# Patient Record
Sex: Male | Born: 1945 | Race: White | Hispanic: No | Marital: Married | Smoking: Never smoker
Health system: Southern US, Community
[De-identification: ages and names within clinical notes are randomized; demographics above are authoritative.]

## PROBLEM LIST (undated history)

## (undated) DIAGNOSIS — R05 Cough: Secondary | ICD-10-CM

## (undated) DIAGNOSIS — E785 Hyperlipidemia, unspecified: Secondary | ICD-10-CM

## (undated) DIAGNOSIS — N400 Enlarged prostate without lower urinary tract symptoms: Secondary | ICD-10-CM

## (undated) DIAGNOSIS — R0683 Snoring: Secondary | ICD-10-CM

## (undated) DIAGNOSIS — C4491 Basal cell carcinoma of skin, unspecified: Secondary | ICD-10-CM

## (undated) DIAGNOSIS — C61 Malignant neoplasm of prostate: Secondary | ICD-10-CM

## (undated) DIAGNOSIS — G2581 Restless legs syndrome: Secondary | ICD-10-CM

## (undated) DIAGNOSIS — C4492 Squamous cell carcinoma of skin, unspecified: Secondary | ICD-10-CM

## (undated) DIAGNOSIS — R059 Cough, unspecified: Secondary | ICD-10-CM

## (undated) HISTORY — PX: NASAL TURBINATE REDUCTION: SHX2072

## (undated) HISTORY — PX: SINOSCOPY: SHX187

## (undated) HISTORY — PX: TONSILLECTOMY: SUR1361

## (undated) HISTORY — PX: SEPTOPLASTY: SUR1290

## (undated) HISTORY — DX: Restless legs syndrome: G25.81

## (undated) HISTORY — PX: APPENDECTOMY: SHX54

## (undated) HISTORY — PX: OTHER SURGICAL HISTORY: SHX169

## (undated) HISTORY — DX: Malignant neoplasm of prostate: C61

## (undated) HISTORY — DX: Benign prostatic hyperplasia without lower urinary tract symptoms: N40.0

## (undated) HISTORY — DX: Snoring: R06.83

## (undated) HISTORY — DX: Hyperlipidemia, unspecified: E78.5

---
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
## 1998-09-12 ENCOUNTER — Ambulatory Visit (HOSPITAL_COMMUNITY): Admission: RE | Admit: 1998-09-12 | Discharge: 1998-09-12 | Payer: Self-pay | Admitting: Orthopaedic Surgery

## 2003-03-01 DIAGNOSIS — C4491 Basal cell carcinoma of skin, unspecified: Secondary | ICD-10-CM

## 2003-03-01 HISTORY — DX: Basal cell carcinoma of skin, unspecified: C44.91

## 2004-05-09 ENCOUNTER — Encounter: Admission: RE | Admit: 2004-05-09 | Discharge: 2004-05-09 | Payer: Self-pay | Admitting: Family Medicine

## 2005-05-29 ENCOUNTER — Ambulatory Visit: Payer: Self-pay | Admitting: Internal Medicine

## 2006-03-07 ENCOUNTER — Ambulatory Visit: Payer: Self-pay | Admitting: Internal Medicine

## 2006-04-14 ENCOUNTER — Ambulatory Visit (HOSPITAL_BASED_OUTPATIENT_CLINIC_OR_DEPARTMENT_OTHER): Admission: RE | Admit: 2006-04-14 | Discharge: 2006-04-14 | Payer: Self-pay | Admitting: Internal Medicine

## 2006-04-20 ENCOUNTER — Ambulatory Visit: Payer: Self-pay | Admitting: Internal Medicine

## 2006-05-05 ENCOUNTER — Ambulatory Visit: Payer: Self-pay | Admitting: Internal Medicine

## 2007-02-28 DIAGNOSIS — R0609 Other forms of dyspnea: Secondary | ICD-10-CM | POA: Insufficient documentation

## 2007-02-28 DIAGNOSIS — J302 Other seasonal allergic rhinitis: Secondary | ICD-10-CM | POA: Insufficient documentation

## 2007-02-28 DIAGNOSIS — R0989 Other specified symptoms and signs involving the circulatory and respiratory systems: Secondary | ICD-10-CM

## 2007-02-28 DIAGNOSIS — J3089 Other allergic rhinitis: Secondary | ICD-10-CM

## 2007-04-30 ENCOUNTER — Encounter: Payer: Self-pay | Admitting: Internal Medicine

## 2007-05-07 ENCOUNTER — Encounter: Payer: Self-pay | Admitting: Internal Medicine

## 2007-05-09 ENCOUNTER — Telehealth: Payer: Self-pay | Admitting: Internal Medicine

## 2007-05-22 ENCOUNTER — Ambulatory Visit: Payer: Self-pay | Admitting: Internal Medicine

## 2007-07-23 ENCOUNTER — Telehealth (INDEPENDENT_AMBULATORY_CARE_PROVIDER_SITE_OTHER): Payer: Self-pay | Admitting: *Deleted

## 2007-08-03 ENCOUNTER — Encounter: Payer: Self-pay | Admitting: Internal Medicine

## 2007-10-14 ENCOUNTER — Encounter: Payer: Self-pay | Admitting: Internal Medicine

## 2007-12-28 ENCOUNTER — Encounter: Payer: Self-pay | Admitting: Internal Medicine

## 2008-06-22 ENCOUNTER — Ambulatory Visit: Payer: Self-pay | Admitting: Internal Medicine

## 2008-06-22 DIAGNOSIS — E785 Hyperlipidemia, unspecified: Secondary | ICD-10-CM

## 2009-06-22 ENCOUNTER — Ambulatory Visit: Payer: Self-pay | Admitting: Internal Medicine

## 2010-06-19 NOTE — Assessment & Plan Note (Signed)
Summary: 12 months/apc   Primary Provider/Referring Provider:  Lupe Carney  CC:  Yearly follow up visit-cough-prod-green; head/nasal congestion.Marland Kitchen  History of Present Illness: 04-Jun-2007- History of Present Illness: Snoring Allergic rhinitis Worked with [facility]- Dr. Georgette Dover. Oral appliance works well enough. Did not have sleep apnea to begin with by NPSG so he doesn't want the expensw of repeating a sleep study with the oral appliance in place. Self pay for this. Worse if sleeps on back so he wears a fanny pack positioned to keep him on his side. Had also seen Dr. Annalee Genta who suggested septoplasty and introduced Veramyst which definitely works better than fluticasone but is too expensive. Takes Zyrtec in the AMs without EDS.  06/22/08- Snoring, allergic rhinitis.  Had some breakthrough snoring with the oral appliance and fannypack to keep him off of back. Dr Annalee Genta did septoplasty, turbinate reduction and uvulectomy. Breathes much better. Veramyst worked better than the fluticasone- better delivery system. More aware of post nasal draingage.Denies hoarseness, headache, earache.  June 22, 2009- Snoring, allergic rhinitis Has an incidental cold. We discussed Neti pot option. He frequently wakes with nasal congestion. We reviewed his use of nasal meds. Hx of nasal surgery and potential urinary retention with systemic decongestants again reviewed. He asks flu vax.      Current Medications (verified): 1)  Zocor 40 Mg Tabs (Simvastatin) .... Take 1 By Mouth Once Daily 2)  Requip 1 Mg  Tabs (Ropinirole Hcl) .... Take 1 Tablet By Mouth Once A Day 3)  Nasalcrom 5.2 Mg/act  Aers (Cromolyn Sodium) .Marland Kitchen.. 1-2 Sprays Each Nostril  4 X Daily 4)  Flonase 50 Mcg/act Susp (Fluticasone Propionate) .... Use 1-2 Sprays in Each Nostril Once Daily 5)  Glucosamine Chondr 1500 Complx  Caps (Glucosamine-Chondroit-Vit C-Mn) 6)  Multivitamins  Tabs (Multiple Vitamin) .... Take 1 By Mouth Once  Daily 7)  Zyrtec Allergy 10 Mg Tabs (Cetirizine Hcl) .... Take 1 By Mouth Once Daily  Allergies (verified): No Known Drug Allergies  Past History:  Past Medical History: Last updated: 06/22/2008 Allergic rhinitis snoring- NPSG NEG for OSA Hyperlipidemia Restless legs  Past Surgical History: Last updated: 06/22/2008 Platoplasty, septoplasty, turbinate reduction 2009 septoplasty age 53 Appendectomy Tonsillectomy  Family History: Last updated: June 04, 2007 Father died CVA  Social History: Last updated: 06/22/2008 Patient never smoked.  Runner Staff psychologist for [employer]  Risk Factors: Smoking Status: never (04-Jun-2007)  Review of Systems      See HPI  The patient denies anorexia, fever, weight loss, weight gain, vision loss, decreased hearing, hoarseness, chest pain, syncope, dyspnea on exertion, peripheral edema, prolonged cough, headaches, hemoptysis, and severe indigestion/heartburn.    Vital Signs:  Patient profile:   65 year old male Weight:      184.38 pounds O2 Sat:      95 % on Room air Pulse rate:   82 / minute BP sitting:   122 / 74  (left arm) Cuff size:   regular  Vitals Entered By: Reynaldo Minium CMA (June 22, 2009 9:12 AM)  O2 Flow:  Room air  Physical Exam  Additional Exam:  General: A/Ox3; pleasant and cooperative, NAD, WDWN SKIN: no rash, lesions NODES: no lymphadenopathy HEENT: [place]/AT, EOM- WNL, Conjuctivae- clear, PERRLA, TM-WNL, Nose- clear, Throat- clear, s/p UPPP NECK: Supple w/ fair ROM, JVD- none, normal carotid impulses w/o bruits Thyroid-  CHEST: Clear to P&A HEART: RRR, no m/g/r heard ABDOMEN: Soft and nl; EAV:WUJW, nl pulses, no edema  NEURO: Grossly intact to  observation      Impression & Recommendations:  Problem # 1:  ALLERGIC RHINITIS (ICD-477.9)  Overall controll has probably been adequate. We discussed Neti pot His updated medication list for this problem includes:    Nasalcrom 5.2 Mg/act Aers  (Cromolyn sodium) .Marland Kitchen... 1-2 sprays each nostril  4 x daily    Flonase 50 Mcg/act Susp (Fluticasone propionate) ..... Use 1-2 sprays in each nostril once daily    Zyrtec Allergy 10 Mg Tabs (Cetirizine hcl) .Marland Kitchen... Take 1 by mouth once daily  Problem # 2:  SNORING (ICD-786.09)  he had used oral appliance for awhile before UPPP. Wife tells him he is gradually snoring more agai-still less than originally. We discused mouthpieces and chinstraps.  Medications Added to Medication List This Visit: 1)  Zocor 40 Mg Tabs (Simvastatin) .... Take 1 by mouth once daily  Other Orders: Est. Patient Level II (29562) Flu Vaccine 53yrs + (13086) Admin 1st Vaccine (57846)  Patient Instructions: 1)  Please schedule a follow-up appointment as needed. 2)  Consider a Neti Pot/ squeeze bottle to rinse your nasal passages 3)  Consider Sudafed-PE and Afrin as OCCASIONAL decongestants 4)  Consider a chin strap to positiion your jaw for help with snoring 5)  Flu vax   Immunizations Administered:  Influenza Vaccine # 1:    Vaccine Type: Fluvax 3+    Site: left deltoid    Mfr: GlaxoSmithKline    Dose: 0.5 ml    Route: IM    Given by: Zackery Barefoot CMA    Exp. Date: 11/16/2009    Lot #: [id number]  Flu Vaccine Consent Questions:    Do you have a history of severe allergic reactions to this vaccine? no    Any prior history of allergic reactions to egg and/or gelatin? no    Do you have a sensitivity to the preservative Thimersol? no    Do you have a past history of Guillan-Barre Syndrome? no    Do you currently have an acute febrile illness? no    Have you ever had a severe reaction to latex? no    Vaccine information given and explained to patient? yes

## 2010-10-02 NOTE — Letter (Signed)
May 22, 2007    Maryjean Ka, D.D.S., M.Ed., M.S.  Assoc Professor, Dept of Dentistry  [employer] [facility]  [address]  [place], [place]  [address]   RE:  JIOVANNI, HEETER  MRN:  [id number]  /  DOB:  July 06, 1945   Dear Dr. Georgette Dover,   Thank you for your good help with Mr. Fetterman, who was fitted with an  oral appliance.  He returned for follow-up today saying his wife is  happier about his snoring, especially as long as he makes an effort to  sleep off the flat of his back.  His baseline sleep study had not shown  significant sleep apnea and he does not want to encourage the expense of  a follow-up sleep study now with the appliance in place.  He does agree  to continue following along.  Overall he is pleased and indicates  willing intention to continue using his oral appliance.  He is very  pleased with his interaction with you and thanked me for making the  referral.   Thank you for your help with all of our patients, and thank you also for  thinking of [place]  during the holiday season.  Warmest regards for the new  year.    Sincerely,      [name], MD, Tonny Bollman, FACP  Electronically Signed    CDY/MedQ  DD: 05/22/2007  DT: 05/23/2007  Job #: [id number]

## 2010-10-05 NOTE — Letter (Signed)
October 30, 2006     RE:  CARDIN, NITSCHKE  MRN:  [id number]  /  DOB:  22-Dec-1945   To whom it may concern:   Mr. Jon Ewing is under my care and requesting consideration for  coverage of an oral appliance to treat snoring and sleep disordered  breathing.  There is significant disruption at home because of his  snoring disturbing his wife's sleep.  He has received appropriate  medical treatment and surgery for reduction of turbinates in the past.  He is using a nasal steroid spray and antihistamines currently.  A  formal nocturnal polysomnogram on April 14, 2006, demonstrated  occasional obstructive events with an apnea/hypopnea index in the normal  range at 3.6 per hour, mild snoring on that study night, and  desaturation to a nadir of 85%.  We have worked through available  options and he is seeking evaluation and treatment through the dental  program at [facility] in [place], [place]  [place], anticipating he will be fitted with a mandibular advancement  device.  This problem has caused significant distress in his home.  I  hope you can be supportive.    Sincerely,      [name], MD, Tonny Bollman, FACP  Electronically Signed    CDY/MedQ  DD: 10/30/2006  DT: 10/30/2006  Job #: [id number]

## 2010-10-05 NOTE — Assessment & Plan Note (Signed)
[facility]                               PULMONARY OFFICE NOTE   Jon Ewing, Jon Ewing                       MRN:          [id number]  DATE:03/07/2006                            DOB:          07-06-1945    PULMONARY/SLEEP FOLLOWUP   PROBLEMS:  1. Snoring.  2. Allergic rhinitis.   HISTORY:  His wife continues to complain about his snoring, saying it is  getting worse.  He is not sure about witnessed apnea and does not think he  jerks his legs.  She is a light sleeper.  There is at least some daytime  tiredness and awareness that he wakes at night.  He has had more problem  this fall with nasal congestion, sneezing, and drip.  He had stopped Alavert  D and we discussed treatment options.   MEDICATION:  1. Lipitor 20 mg.  2. Flonase.   No medication allergy.   OBJECTIVE:  Weight 172 pounds, BP 118/78, pulse regular at 78, room air  saturation 97%.  Mild watery sniffing.  Conjunctivae are clear.  Turbinates  are a little edematous.  Color is about normal.  I do not see postnasal  drainage or polyps.  There is no cervical adenopathy and voice quality is  normal.  Lungs are clear.  Heart sounds are regular without murmur.   IMPRESSION:  1. Allergic rhinitis with seasonal exacerbation.  2. Chronic complaints of snoring with some septal deviation.  He has      decided it is time for a sleep study.   PLAN:  1. We discussed the basics of obstructive sleep apnea and its relation to      nasal congestion.  2. Schedule split protocol sleep study at the [facility] system center with      office return to follow.       [name], MD, FCCP, FACP   CDY/MedQ DD:  03/08/2006 DT:  03/10/2006 Job #:  [id number]   cc:   [name], M.D.  [facility]

## 2010-10-05 NOTE — Procedures (Signed)
NAME:  Jon Ewing, Jon Ewing                ACCOUNT NO.:  [id number]   MEDICAL RECORD NO.:  [id number]          PATIENT TYPE:  OUT   LOCATION:  SLEEP CENTER                 FACILITY:  [facility]   PHYSICIAN:  [name], MD, FCCP, FACPDATE OF BIRTH:  January 10[birth year] 1948   DATE OF STUDY:  04/14/2006                            NOCTURNAL POLYSOMNOGRAM   INDICATION FOR STUDY:  Hypersomnia with sleep apnea.   EPWORTH SLEEPINESS SCORE:  2/24, BMI 25.7, weight 172 pounds.   MEDICATIONS:  Lipitor and Flonase.   SLEEP ARCHITECTURE:  Total sleep time 282 minutes with sleep efficiency  73%, stage I was 6%, stage II 77%, stages III and IV 9%, REM 9% of total  sleep time.  Sleep latency 75 minutes, REM latency 107 minutes, awake  after sleep onset 25 minutes.  Arousal index 19.6.  No bedtime  medication was reported.   RESPIRATORY DATA:  Apnea/hypopnea index (AHI, RDI) 3.6 obstructive  events per hour which is within normal limits (normal range 0 to 5 per  hour).  There were 12 obstructive apneas at 5 hypopneas. Events were  predominantly noted as expected while sleeping supine.  REM AHI 31.8  There were insufficient events to trigger CPAP titration by split  protocol.   OXYGEN DATA:  Mild snoring with oxygen desaturation to a nadir of 85%.  Mean oxygen saturation through the study was 94% on room air.   CARDIAC DATA:  Normal sinus rhythm.   MOVEMENT-PARASOMNIA:  Frequent limb jerks with a total of 196 events  recorded of which only three were associated with arousal or awakening  for periodic limb movement with arousal index of 0.6 per hour.   IMPRESSIONS-RECOMMENDATIONS:  1. Short total sleep time with reduced REM percentage compared with      expected, of uncertain significance.  2. Occasional obstructive sleep disordered breathing events, AHI 3.6      per hour.  Events were associated with supine position and REM      sleep stage.  Mild snoring with oxygen desaturation to a nadir of      85%.  3.  Frequent limb jerks were not associated with definite sleep      disturbance on this study but suggest the possibility that this may      become a sleep disturbing factor at times while at home.      [name], MD, FCCP, FACP  Diplomate, Biomedical engineer of Sleep Medicine  Electronically Signed     CDY/MEDQ  D:  04/20/2006 14:07:41  T:  04/21/2006 11:32:48  Job:  [id number]

## 2010-10-05 NOTE — Assessment & Plan Note (Signed)
[facility]                             PULMONARY OFFICE NOTE   Jon Ewing, Jon Ewing                       MRN:          [id number]  DATE:05/05/2006                            DOB:          05/16/1946    PULMONARY/SLEEP MEDICINE FOLLOWUP:   PROBLEM:  1. Snoring.  2. Allergic rhinitis.   HISTORY:  His sleep study November 26 recorded a short total sleep time  with reduced REM.  He only had occasional obstructive events with an  index of 3.6 per hour which is within normal limits.  These were  primarily while sleeping supine and in REM, suggesting that slleping off  the flat of his back may be sufficient.  Oxygen desaturation was to 85%  transiently.  He had some leg jerks that were not associated with sleep  disturbance, raising the possibility that some times leg jerks may be  part of the problem at home, but again, the biggest complaint is really  from his wife about his snoring.  We talked through options in detail  today, including the available treatments for snoring up to and  including an option to self-pay for a trial of CPAP, which is not  usually cost effective in my experience for this problem.  He has not  been having substantial nasal congestion and he does have a history of  turbinate surgery.  We discussed options for treatment of leg jerks and  the availability of oral appliances and surgical evaluation.   MEDICATIONS:  1. Lipitor 20 mg.  2. Flonase 2 sprays each nostril.  3. He has tried ranitidine, Astelin, Alavert D and Vivactil without      adequate effect on snore.   No medication allergy.   OBJECTIVE:  Weight 175 pounds, blood pressure 128/82, pulse regular and  69, room air saturation 98%.  His nasal airways seems adequate and he can breath comfortably through  his nose with his mouth closed.  Pharynx is clear without erythema or  drainage.  There is no strider or thyromegaly.  LUNGS:  Are clear.  HEART:  Sounds are  regular without murmur.   IMPRESSION:  1. History of allergic rhinitis.  Skin test positive in the past and      managed with nasal steroids, as needed antihistamines.  2. Snoring.  3. 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   PLAN:  1. I offered referral for evaluation of an oral appliance to advance      his mandible and referral for ENT surgical evaluation to look at      what could be done with his upper airway because of complaint of      snoring.  2. Samples Duratuss PE 1 every 12 hours p.r.n.  3. Requip starter pack.  4. Scheduled to return in 1 year, earlier p.r.n.   ADDENDUM:  Date May 30, 2006.  He had called back December 26 asking for referral for an oral appliance  evaluation and referral for ENT evaluation.  We will help with  those.  He also would like prescriptions for Astelin nasal spray and for Requip  0.5 mg nightly, which were called in to [facility].  He saw Dorcas Mcmurray, DDS for consideration for an oral appliance for snoring and did  not have a good experience.  There was poor communication and he felt he  did not get enough explanation at that office.  I have offered contact  information for the dental program at [facility] and he is going  to call there to see what information he can get about oral appliances  for management of snoring and sleep apnea.  He is going to keep his ENT  appointment for evaluation scheduled with Dr. Annalee Genta on January 25.     [name], MD, Tonny Bollman, FACP  Electronically Signed    CDY/MedQ  DD: 05/30/2006  DT: 05/30/2006  Job #: [id number]   cc:   [name], M.D.  [name], M.D.

## 2010-12-31 ENCOUNTER — Other Ambulatory Visit: Payer: Self-pay | Admitting: Internal Medicine

## 2011-01-02 ENCOUNTER — Telehealth: Payer: Self-pay | Admitting: Internal Medicine

## 2011-01-02 MED ORDER — FLUTICASONE PROPIONATE 50 MCG/ACT NA SUSP: 2.0000 | Freq: Every day | NASAL | Status: DC

## 2011-01-02 NOTE — Telephone Encounter (Signed)
Pt has appt on 02-04-11 at 945am; he understands to keep this appt and I have sent 1 time only for Flonase.

## 2011-01-30 ENCOUNTER — Encounter: Payer: Self-pay | Admitting: Internal Medicine

## 2011-02-04 ENCOUNTER — Encounter: Payer: Self-pay | Admitting: Internal Medicine

## 2011-02-04 ENCOUNTER — Ambulatory Visit (INDEPENDENT_AMBULATORY_CARE_PROVIDER_SITE_OTHER): Payer: BC Managed Care – PPO | Admitting: Internal Medicine

## 2011-02-04 VITALS — BP 130/78 | HR 70 | Ht 68.0 in | Wt 175.8 lb

## 2011-02-04 DIAGNOSIS — R0609 Other forms of dyspnea: Secondary | ICD-10-CM

## 2011-02-04 DIAGNOSIS — R0989 Other specified symptoms and signs involving the circulatory and respiratory systems: Secondary | ICD-10-CM

## 2011-02-04 DIAGNOSIS — Z23 Encounter for immunization: Secondary | ICD-10-CM

## 2011-02-04 DIAGNOSIS — J309 Allergic rhinitis, unspecified: Secondary | ICD-10-CM

## 2011-02-04 MED ORDER — IPRATROPIUM BROMIDE 0.03 % NA SOLN: 2.0000 | Freq: Four times a day (QID) | NASAL | Status: DC

## 2011-02-04 NOTE — Assessment & Plan Note (Signed)
Some of his perennial irritation may be due to the old UPPP surgery and not amenable to medication. Plan- try Ipratropium           Flu vax

## 2011-02-04 NOTE — Progress Notes (Signed)
Subjective:    Patient ID: Jon Ewing, male    DOB: 1946-03-13, 65 y.o.   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 Annalee Genta. In the beginning a sleep study was negative. He has used an oral appliance. He is using Requip for limb jerks. He has a history of nasal surgery. He has slept with accommodation to keep him from sleeping on his back Daily nasal congestion is about the same chronically- spits up some drainage. Little chest discomfort. Denies infection or headaches, purulence, sneezing. Changed from zyrtec to generic claritin. Continues nasalcrom and flonase. Tried singulair years ago. Denies glaucoma or prostatism.  Review of Systems Constitutional:   No-   weight loss, night sweats, fevers, chills, fatigue, lassitude. HEENT:   No-  headaches, difficulty swallowing, tooth/dental problems, sore throat,       No-  sneezing, itching, ear ache,     +nasal congestion, post nasal drip,  CV:  No-   chest pain, orthopnea, PND, swelling in lower extremities, anasarca, dizziness, palpitations Resp: No-   shortness of breath with exertion or at rest.              No-   productive cough,  No non-productive cough,  No-  coughing up of blood.              No-   change in color of mucus.  No- wheezing.   Skin: No-   rash or lesions. GI:  No-   heartburn, indigestion, abdominal pain, nausea, vomiting, diarrhea,                 change in bowel habits, loss of appetite GU: No-   dysuria, change in color of urine, no urgency or frequency.  No- flank pain. MS:  No-   joint pain or swelling.  No- decreased range of motion.  No- back pain. Neuro-  Psych:  No- change in mood or affect. No depression or anxiety.  No memory loss.     Objective:   Physical Exam General- Alert, Oriented, Affect-appropriate, Distress- none acute,  medium build. Skin-  rash-none, lesions- none, excoriation- none Lymphadenopathy- none Head- atraumatic            Eyes- Gross vision intact, PERRLA, conjunctivae clear secretions            Ears- Hearing, canals- normal            Nose- Clear, No-Septal dev, mucus, polyps, erosion, perforation             Throat-   + s/p UPPP , mucosa clear , drainage- none, tonsils- atrophic Neck- flexible , trachea midline, no stridor , thyroid nl, carotid no bruit Chest - symmetrical excursion , unlabored           Heart/CV- RRR , no murmur , no gallop  , no rub, nl s1 s2                           - JVD- none , edema- none, stasis changes- none, varices- none           Lung- clear to P&A, wheeze- none, cough- none , dullness-none, rub- none           Chest wall-  Abd- tender-no, distended-no, bowel sounds-present, HSM- no Br/ Gen/ Rectal- Not done, not  indicated Extrem- cyanosis- none, clubbing, none, atrophy- none, strength- nl Neuro- grossly intact to observation         Assessment & Plan:

## 2011-02-04 NOTE — Patient Instructions (Signed)
Script sent to try adding ipratropium nasal spray on an as-needed basis  Flu vax

## 2011-02-08 ENCOUNTER — Other Ambulatory Visit: Payer: Self-pay | Admitting: Internal Medicine

## 2011-02-09 NOTE — Assessment & Plan Note (Signed)
Makes an effort to sleep often for his back has not gained weight.

## 2011-11-05 ENCOUNTER — Other Ambulatory Visit: Payer: Self-pay | Admitting: Internal Medicine

## 2012-03-11 ENCOUNTER — Other Ambulatory Visit: Payer: Self-pay | Admitting: Internal Medicine

## 2012-05-04 ENCOUNTER — Other Ambulatory Visit: Payer: Self-pay | Admitting: Internal Medicine

## 2012-05-04 MED ORDER — FLUTICASONE PROPIONATE 50 MCG/ACT NA SUSP: NASAL | Status: DC

## 2012-05-04 NOTE — Telephone Encounter (Signed)
Rx refilled this time only-note sent to have patient make and keep OV with CY for more refills.

## 2013-10-13 ENCOUNTER — Other Ambulatory Visit: Payer: Self-pay | Admitting: Family Medicine

## 2013-10-13 ENCOUNTER — Ambulatory Visit
Admission: RE | Admit: 2013-10-13 | Discharge: 2013-10-13 | Disposition: A | Discharge status: Home / Self Care | Payer: Medicare Other | Source: Ambulatory Visit | Attending: Family Medicine | Admitting: Family Medicine

## 2013-10-13 DIAGNOSIS — M25579 Pain in unspecified ankle and joints of unspecified foot: Secondary | ICD-10-CM

## 2014-03-15 ENCOUNTER — Ambulatory Visit (HOSPITAL_COMMUNITY): Payer: Medicare Other | Attending: Internal Medicine | Admitting: Radiology

## 2014-03-15 ENCOUNTER — Other Ambulatory Visit (HOSPITAL_COMMUNITY): Payer: Self-pay | Admitting: Family Medicine

## 2014-03-15 DIAGNOSIS — R011 Cardiac murmur, unspecified: Secondary | ICD-10-CM

## 2014-03-15 NOTE — Progress Notes (Signed)
Echocardiogram performed.  

## 2014-07-04 ENCOUNTER — Encounter: Payer: Self-pay | Admitting: Internal Medicine

## 2014-07-04 ENCOUNTER — Other Ambulatory Visit (INDEPENDENT_AMBULATORY_CARE_PROVIDER_SITE_OTHER): Payer: Medicare Other

## 2014-07-04 ENCOUNTER — Encounter (INDEPENDENT_AMBULATORY_CARE_PROVIDER_SITE_OTHER): Payer: Self-pay

## 2014-07-04 ENCOUNTER — Ambulatory Visit (INDEPENDENT_AMBULATORY_CARE_PROVIDER_SITE_OTHER): Payer: Medicare Other | Admitting: Internal Medicine

## 2014-07-04 VITALS — BP 120/70 | HR 80 | Ht 68.0 in | Wt 182.6 lb

## 2014-07-04 DIAGNOSIS — J309 Allergic rhinitis, unspecified: Secondary | ICD-10-CM

## 2014-07-04 DIAGNOSIS — J3089 Other allergic rhinitis: Secondary | ICD-10-CM

## 2014-07-04 DIAGNOSIS — J302 Other seasonal allergic rhinitis: Secondary | ICD-10-CM

## 2014-07-04 LAB — CBC WITH DIFFERENTIAL/PLATELET
BASOS PCT: 1.4 % (ref 0.0–3.0)
Basophils Absolute: 0.1 10*3/uL (ref 0.0–0.1)
EOS ABS: 0.4 10*3/uL (ref 0.0–0.7)
Eosinophils Relative: 6 % — ABNORMAL HIGH (ref 0.0–5.0)
HCT: 40.5 % (ref 39.0–52.0)
Hemoglobin: 13.9 g/dL (ref 13.0–17.0)
Lymphocytes Relative: 25.6 % (ref 12.0–46.0)
Lymphs Abs: 1.7 10*3/uL (ref 0.7–4.0)
MCHC: 34.4 g/dL (ref 30.0–36.0)
MCV: 89.3 fl (ref 78.0–100.0)
MONOS PCT: 9.8 % (ref 3.0–12.0)
Monocytes Absolute: 0.7 10*3/uL (ref 0.1–1.0)
Neutro Abs: 3.8 10*3/uL (ref 1.4–7.7)
Neutrophils Relative %: 57.2 % (ref 43.0–77.0)
Platelets: 300 10*3/uL (ref 150.0–400.0)
RBC: 4.53 Mil/uL (ref 4.22–5.81)
RDW: 13.7 % (ref 11.5–15.5)
WBC: 6.7 10*3/uL (ref 4.0–10.5)

## 2014-07-04 MED ORDER — MONTELUKAST SODIUM 10 MG PO TABS: 10.0000 mg | ORAL_TABLET | Freq: Every day | ORAL | Status: DC

## 2014-07-04 MED ORDER — FLUTICASONE FUROATE 27.5 MCG/SPRAY NA SUSP: NASAL | Status: DC

## 2014-07-04 NOTE — Patient Instructions (Signed)
Order- lab- Allergy profile, food IgE profile, CBC w diff    Dx allergic rhinitis  Script to try Veramyst nasal steroid spray instead of Flonase for comparison  Consider use of an otc nasal saline rinse by squeeze bottle or nasal mister device   Script to retry Singulair

## 2014-07-04 NOTE — Progress Notes (Signed)
Subjective:    Patient ID: Jon Ewing, male    DOB: 1945/12/12, 69 y.o.   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 Wilburn Cornelia. In the beginning a sleep study was negative. He has used an oral appliance. He is using Requip for limb jerks. He has a history of nasal surgery. He has slept with accommodation to keep him from sleeping on his back Daily nasal congestion is about the same chronically- spits up some drainage. Little chest discomfort. Denies infection or headaches, purulence, sneezing. Changed from zyrtec to generic claritin. Continues nasalcrom and flonase. Tried singulair years ago. Denies glaucoma or prostatism.  07/04/14- 77 yoM never smoker comes to re-establish after last visit 2012 FOLLOW FOR:  Allergies; lot of drainage, PND.  Using nasalcrom, flonase, claritin His concern is increasing nasal stuffiness and postnasal drainage. Sleeps with water and cough drops by his bed. Perennial but a little worse in spring and fall. Tried Zyrtec which aggravated his BPH. History of palatoplasty. Quit using an oral appliance for snoring. Denies wheeze or chest tightness.  Review of Systems Constitutional:   No-   weight loss, night sweats, fevers, chills, fatigue, lassitude. HEENT:   No-  headaches, difficulty swallowing, tooth/dental problems, sore throat,       No-  sneezing, itching, ear ache,     +nasal congestion, + post nasal drip,  CV:  No-   chest pain, orthopnea, PND, swelling in lower extremities, anasarca, dizziness, palpitations Resp: No-   shortness of breath with exertion or at rest.              No-   productive cough,  No non-productive cough,  No-  coughing up of blood.              No-   change in color of mucus.  No- wheezing.   Skin: No-   rash or lesions. GI:  No-   heartburn, indigestion, abdominal pain, nausea,  vomiting, diarrhea,                 change in bowel habits, loss of appetite GU: No-   dysuria, change in color of urine, no urgency or frequency.  No- flank pain. MS:  No-   joint pain or swelling.  No- decreased range of motion.  No- back pain. Neuro-  Psych:  No- change in mood or affect. No depression or anxiety.  No memory loss.     Objective:   Physical Exam General- Alert, Oriented, Affect-appropriate, Distress- none acute,  medium build. Skin- rash-none, lesions- none, excoriation- none Lymphadenopathy- none Head- atraumatic            Eyes- Gross vision intact, PERRLA, conjunctivae clear secretions            Ears- Hearing, canals- normal            Nose- + crusting right nostril, No-Septal dev, mucus, polyps, erosion, perforation             Throat-   + s/p UPPP , mucosa clear , drainage- none, tonsils- atrophic Neck- flexible , trachea midline, no stridor , thyroid nl, carotid no bruit Chest - symmetrical excursion , unlabored           Heart/CV- RRR , no murmur , no gallop  , no rub, nl s1 s2                           -  JVD- none , edema- none, stasis changes- none, varices- none           Lung- clear to P&A, wheeze- none, cough- none , dullness-none, rub- none           Chest wall-  Abd- tender-no, distended-no, bowel sounds-present, HSM- no Br/ Gen/ Rectal- Not done, not indicated Extrem- cyanosis- none, clubbing, none, atrophy- none, strength- nl Neuro- grossly intact to observation    Assessment & Plan:

## 2014-07-04 NOTE — Assessment & Plan Note (Signed)
He is no longer finding Nasalcrom, Flonase and Claritin as effective. Stronger antihistamines seem to aggravate his prostate symptoms. He would like to try Veramyst which he thought worked better in the past. Plan-allergy profiles, trial of Veramyst instead of Flonase. Consider nasal saline rinse and we discussed some options with that. Could try a nasal mister device. I don't think he has a chronic sinus infection.

## 2014-07-05 LAB — ALLERGEN FOOD PROFILE SPECIFIC IGE
Apple: <0.1 kU/L
Chicken IgE: <0.1 kU/L
Corn: <0.1 kU/L
IgE (Immunoglobulin E), Serum: 57 kU/L (ref <115)
Peanut IgE: <0.1 kU/L
Shrimp IgE: <0.1 kU/L
Soybean IgE: <0.1 kU/L
Wheat IgE: <0.1 kU/L

## 2014-07-05 LAB — ALLERGY FULL PROFILE
Allergen, D pternoyssinus,d7: <0.1 kU/L
Allergen,Goose feathers, e70: <0.1 kU/L
Alternaria Alternata: <0.1 kU/L
Aspergillus fumigatus, m3: <0.1 kU/L
Bahia Grass: <0.1 kU/L
Bermuda Grass: <0.1 kU/L
Candida Albicans: <0.1 kU/L
Cat Dander: <0.1 kU/L
Common Ragweed: <0.1 kU/L
D. farinae: <0.1 kU/L
Dog Dander: <0.1 kU/L
Elm IgE: <0.1 kU/L
Fescue: <0.1 kU/L
G005 Rye, Perennial: <0.1 kU/L
G009 Red Top: <0.1 kU/L
Helminthosporium halodes: <0.1 kU/L
House Dust Hollister: <0.1 kU/L
Lamb's Quarters: <0.1 kU/L
Oak: <0.1 kU/L
Plantain: <0.1 kU/L
Stemphylium Botryosum: <0.1 kU/L
Sycamore Tree: <0.1 kU/L
Timothy Grass: <0.1 kU/L

## 2014-07-07 ENCOUNTER — Telehealth: Payer: Self-pay | Admitting: Internal Medicine

## 2014-07-07 NOTE — Telephone Encounter (Signed)
Notes Recorded by Deneise Lever, MD on 07/05/2014 at 1:13 PM Allergy profiles are within normal. This makes irritant and non-specific environmental triggers like dust, , temperature changes and wind the most likely explanations. ---  I spoke with patient about results and he verbalized understanding and had no questions

## 2014-08-16 ENCOUNTER — Institutional Professional Consult (permissible substitution): Payer: Self-pay | Admitting: Internal Medicine

## 2015-06-18 ENCOUNTER — Other Ambulatory Visit: Payer: Self-pay | Admitting: Internal Medicine

## 2015-12-26 DIAGNOSIS — C4492 Squamous cell carcinoma of skin, unspecified: Secondary | ICD-10-CM

## 2015-12-26 HISTORY — DX: Squamous cell carcinoma of skin, unspecified: C44.92

## 2016-10-09 DIAGNOSIS — C4491 Basal cell carcinoma of skin, unspecified: Secondary | ICD-10-CM

## 2016-10-09 HISTORY — DX: Basal cell carcinoma of skin, unspecified: C44.91

## 2018-04-07 ENCOUNTER — Other Ambulatory Visit: Payer: Self-pay | Admitting: Family Medicine

## 2018-04-07 ENCOUNTER — Ambulatory Visit
Admission: RE | Admit: 2018-04-07 | Discharge: 2018-04-07 | Disposition: A | Discharge status: Home / Self Care | Payer: Medicare Other | Source: Ambulatory Visit | Attending: Family Medicine | Admitting: Family Medicine

## 2018-04-07 DIAGNOSIS — J069 Acute upper respiratory infection, unspecified: Secondary | ICD-10-CM

## 2018-05-28 ENCOUNTER — Encounter: Payer: Self-pay | Admitting: Emergency Medicine

## 2018-05-28 ENCOUNTER — Telehealth: Payer: Self-pay | Admitting: Emergency Medicine

## 2018-05-28 ENCOUNTER — Ambulatory Visit: Payer: Medicare Other | Admitting: Emergency Medicine

## 2018-05-28 DIAGNOSIS — R053 Chronic cough: Secondary | ICD-10-CM

## 2018-05-28 DIAGNOSIS — R05 Cough: Secondary | ICD-10-CM

## 2018-05-28 MED ORDER — BENZONATATE 200 MG PO CAPS: 200.0000 mg | ORAL_CAPSULE | Freq: Four times a day (QID) | ORAL | 1 refills | Status: DC | PRN

## 2018-05-28 MED ORDER — PREDNISONE 10 MG PO TABS: 30.0000 mg | ORAL_TABLET | Freq: Every day | ORAL | 0 refills | Status: AC

## 2018-05-28 MED ORDER — HYDROCODONE-HOMATROPINE 5-1.5 MG/5ML PO SYRP: 5.0000 mL | ORAL_SOLUTION | Freq: Four times a day (QID) | ORAL | 0 refills | Status: DC | PRN

## 2018-05-28 MED ORDER — OMEPRAZOLE 20 MG PO CPDR: 20.0000 mg | DELAYED_RELEASE_CAPSULE | Freq: Two times a day (BID) | ORAL | 5 refills | Status: DC

## 2018-05-28 MED ORDER — MONTELUKAST SODIUM 10 MG PO TABS: 10.0000 mg | ORAL_TABLET | Freq: Every day | ORAL | 5 refills | Status: DC

## 2018-05-28 NOTE — Assessment & Plan Note (Signed)
Please continue fluticasone nasal spray 2 sprays each nostril once daily. We will restart Singulair 10 mg each evening. Please increase your omeprazole to 20 mg twice a day.  Take this medication 1 hour before or after eating. Please take prednisone 30 mg daily for 5 days. Please use Tessalon Perles 200 mg up to every 6 hours if needed to suppress your cough. Please use Hycodan 5 to 10 cc up to every 6 hours if needed to suppress your cough. Please call our office in 2 weeks to update [place] on how your cough is doing.  Depending on this we will make recommendations about changing medication, your trip to [place]. If your cough continues then we may decide to expand the work-up, perform pulmonary function testing, perform an airway inspection. Follow with Dr Lamonte Sakai in 2 - 3 months.

## 2018-05-28 NOTE — Telephone Encounter (Signed)
I think he means RSV.  This is a possible cause, although I would expect some improvement by now. It certainly did sound like this started as a viral process. The main concern if this were RSV would be that he would have been contagious for many weeks. At this point, not sure that there is any reason to check for RSV

## 2018-05-28 NOTE — Telephone Encounter (Signed)
Called and spoke to pt, who is requesting his dx from today's visit with RB. I have provided pt with chronic cough dx per 05/28/18 office note. Pt also wanted to ask RB if it is a possibility that he has RVS?  RB please advise. Thanks

## 2018-05-28 NOTE — Patient Instructions (Signed)
Please continue fluticasone nasal spray 2 sprays each nostril once daily. We will restart Singulair 10 mg each evening. Please increase your omeprazole to 20 mg twice a day.  Take this medication 1 hour before or after eating. Please take prednisone 30 mg daily for 5 days. Please use Tessalon Perles 200 mg up to every 6 hours if needed to suppress your cough. Please use Hycodan 5 to 10 cc up to every 6 hours if needed to suppress your cough. Please call our office in 2 weeks to update [place] on how your cough is doing.  Depending on this we will make recommendations about changing medication, your trip to [place]. If your cough continues then we may decide to expand the work-up, perform pulmonary function testing, perform an airway inspection. Follow with Dr Lamonte Sakai in 2 - 3 months.

## 2018-05-28 NOTE — Progress Notes (Signed)
Subjective:    Patient ID: Jon Ewing, male    DOB: 12-Jan-1946, 73 y.o.   MRN: [id number]  HPI 73 year old man, never smoker with history of obstructive sleep apnea, restless leg syndrome chronic allergic rhinitis.  Has a history of prostate cancer, a deviated septum and nasal surgery.  He has been on multiple allergy medications in the past including cromolyn spray, nasal steroid, Veramyst.  He is currently on flonase.  His IgE in 2016 was 57 with a negative serological allergy profile.  He is on omeprazole 20 mg once daily.  He is here today with a persistent cough, started about 11 weeks ago after his wife had a URI. Started as a barking cough, dry. Usually during the day. He was treated for bronchitis w abx in Nov, no response. Then again two weeks later. No real change. Received prednisone in late Nov - did lighten the cough some, but still occurring. Cough syrup with hydrocodone helped also. Started omeprazole late December. He was he has developed head congestion more recently, over the last month. Then started sneezing a few days ago. He has started having colored nasal mucous.    Review of Systems  Constitutional: Negative for fever and unexpected weight change.  HENT: Positive for congestion, postnasal drip, rhinorrhea and sinus pressure. Negative for dental problem, ear pain, nosebleeds, sneezing, sore throat and trouble swallowing.   Eyes: Negative for redness and itching.  Respiratory: Positive for cough, chest tightness, shortness of breath and wheezing.   Cardiovascular: Negative for palpitations and leg swelling.  Gastrointestinal: Negative for nausea and vomiting.  Genitourinary: Negative for dysuria.  Musculoskeletal: Negative for joint swelling.  Skin: Negative for rash.  Neurological: Positive for headaches.  Hematological: Does not bruise/bleed easily.  Psychiatric/Behavioral: Negative for dysphoric mood. The patient is not nervous/anxious.    Past Medical History:    Diagnosis Date  . Allergic rhinitis   . Enlarged prostate   . Hyperlipidemia   . Prostate cancer ([place])   . Restless leg   . Snoring    NPSG NEG for OSA     Family History  Problem Relation Age of Onset  . Stroke Father      Social History   Socioeconomic History  . Marital status: Married    Spouse name: Not on file  . Number of children: 3  . Years of education: Not on file  . Highest education level: Not on file  Occupational History  . Occupation: retired  Scientific laboratory technician  . Financial resource strain: Not on file  . Food insecurity:    Worry: Not on file    Inability: Not on file  . Transportation needs:    Medical: Not on file    Non-medical: Not on file  Tobacco Use  . Smoking status: Never Smoker  . Smokeless tobacco: Never Used  Substance and Sexual Activity  . Alcohol use: Yes    Alcohol/week: 0.0 standard drinks    Comment: 2 alcoholic beverages daily  . Drug use: No  . Sexual activity: Not on file  Lifestyle  . Physical activity:    Days per week: Not on file    Minutes per session: Not on file  . Stress: Not on file  Relationships  . Social connections:    Talks on phone: Not on file    Gets together: Not on file    Attends religious service: Not on file    Active member of club or organization: Not on file  Attends meetings of clubs or organizations: Not on file    Relationship status: Not on file  . Intimate partner violence:    Fear of current or ex partner: Not on file    Emotionally abused: Not on file    Physically abused: Not on file    Forced sexual activity: Not on file  Other Topics Concern  . Not on file  Social History Narrative   Insurance account manager for [employer]           No Known Allergies   Outpatient Medications Prior to Visit  Medication Sig Dispense Refill  . finasteride (PROPECIA) 1 MG tablet Take 1 mg by mouth daily. Pt unsure of dose    . fluticasone (FLONASE) 50 MCG/ACT nasal spray USE 2  SPRAYS IN EACH NOSTRIL ONCE DAILY 16 g 0  . omeprazole (PRILOSEC) 20 MG capsule Take 20 mg by mouth daily.    Marland Kitchen rOPINIRole (REQUIP) 1 MG tablet Take 1 mg by mouth at bedtime.      . simvastatin (ZOCOR) 40 MG tablet Take 40 mg by mouth at bedtime.      . tamsulosin (FLOMAX) 0.4 MG CAPS capsule Take by mouth.    Marland Kitchen aspirin EC 81 MG tablet Take by mouth.    . cromolyn (NASALCROM) 5.2 MG/ACT nasal spray Place 1-2 sprays into the nose 4 (four) times daily.      Marland Kitchen etodolac (LODINE) 400 MG tablet Take by mouth.    . fluticasone (VERAMYST) 27.5 MCG/SPRAY nasal spray 1-2 sprays each nostril once or twice daily 10 g 12  . Glucosamine-Chondroit-Vit C-Mn (GLUCOSAMINE 1500 COMPLEX) CAPS Take by mouth.      . loratadine (CLARITIN) 10 MG tablet Take 10 mg by mouth daily.      . montelukast (SINGULAIR) 10 MG tablet TAKE ONE TABLET AT BEDTIME. 30 tablet 0   No facility-administered medications prior to visit.         Objective:   Physical Exam Vitals:   05/28/18 1130  BP: 112/68  Pulse: 88  SpO2: 96%  Weight: 177 lb (80.3 kg)   Gen: Pleasant, well-nourished, in no distress,  normal affect  ENT: No lesions,  mouth clear,  oropharynx clear, no postnasal drip, some congestion  Neck: No JVD, no stridor  Lungs: No use of accessory muscles, no crackles or wheezing on normal respiration, no wheeze on forced expiration  Cardiovascular: RRR, heart sounds normal, no murmur or gallops, no peripheral edema  Musculoskeletal: No deformities, no cyanosis or clubbing  Neuro: alert, awake, non focal  Skin: Warm, no lesions or rash      Assessment & Plan:  Chronic cough Please continue fluticasone nasal spray 2 sprays each nostril once daily. We will restart Singulair 10 mg each evening. Please increase your omeprazole to 20 mg twice a day.  Take this medication 1 hour before or after eating. Please take prednisone 30 mg daily for 5 days. Please use Tessalon Perles 200 mg up to every 6 hours if needed  to suppress your cough. Please use Hycodan 5 to 10 cc up to every 6 hours if needed to suppress your cough. Please call our office in 2 weeks to update [place] on how your cough is doing.  Depending on this we will make recommendations about changing medication, your trip to [place]. If your cough continues then we may decide to expand the work-up, perform pulmonary function testing, perform an airway inspection. Follow with Dr Lamonte Sakai  in 2 - 3 months.  Baltazar Apo, MD, PhD 05/28/2018, 12:04 PM [facility] [phone] or if no answer [phone]

## 2018-05-29 NOTE — Telephone Encounter (Signed)
Called pt and advised message from the provider. Pt understood and verbalized understanding. Nothing further is needed.    

## 2018-06-04 ENCOUNTER — Encounter: Payer: Self-pay | Admitting: Nurse Practitioner

## 2018-06-04 ENCOUNTER — Ambulatory Visit: Payer: Medicare Other | Admitting: Nurse Practitioner

## 2018-06-04 ENCOUNTER — Ambulatory Visit (INDEPENDENT_AMBULATORY_CARE_PROVIDER_SITE_OTHER)
Admission: RE | Admit: 2018-06-04 | Discharge: 2018-06-04 | Disposition: A | Discharge status: Home / Self Care | Payer: Medicare Other | Source: Ambulatory Visit | Attending: Nurse Practitioner | Admitting: Nurse Practitioner

## 2018-06-04 VITALS — BP 120/70 | HR 93 | Temp 98.3°F | Ht 68.0 in | Wt 177.2 lb

## 2018-06-04 DIAGNOSIS — J329 Chronic sinusitis, unspecified: Secondary | ICD-10-CM | POA: Insufficient documentation

## 2018-06-04 DIAGNOSIS — R05 Cough: Secondary | ICD-10-CM

## 2018-06-04 DIAGNOSIS — R053 Chronic cough: Secondary | ICD-10-CM

## 2018-06-04 MED ORDER — MOXIFLOXACIN HCL 400 MG PO TABS: 400.0000 mg | ORAL_TABLET | Freq: Every day | ORAL | 0 refills | Status: DC

## 2018-06-04 MED ORDER — PHENYLEPHRINE HCL 1 % NA SOLN
1.0000 [drp] | Freq: Once | NASAL | Status: AC
  Administered 2018-06-04: 1 [drp] via NASAL

## 2018-06-04 MED ORDER — NYSTATIN 100000 UNIT/ML MT SUSP: 5.0000 mL | Freq: Four times a day (QID) | OROMUCOSAL | 0 refills | Status: DC

## 2018-06-04 NOTE — Progress Notes (Signed)
@Patient  ID: Jon Ewing, male    DOB: 1945/10/23, 73 y.o.   MRN: [id number]  Chief Complaint  Patient presents with  . Chronic Cough    Referring provider: Alroy Dust, L.Marlou Sa, MD  HPI  73 year old male never smoker with allergic rhinitis and chronic cough followed by Dr. Lamonte Sakai.  Tests:  CXR 06/07/18>> no active cardiopulmonary disease.  OV 06/05/18 - follow up cough Patient presents today for follow-up on chronic cough.  He states that he is been coughing since this past November.  He was last seen by Dr. Lamonte Sakai on 05/28/2018.  States that he has been complying with the instructions that Dr. Curley Spice gave him for his chronic cough.  He is compliant with Flonase, Singulair, omeprazole, salon Perles, and Hycodan.  Completed his prednisone that was prescribed by Dr. Lamonte Sakai.  He states that unfortunately his cough has worsened.  He does state that he has sinus congestion pressure and pain.  He does complain of postnasal drip.  States that his cough is productive of green sputum.  He is concerned because he has a upcoming trip to [place] planned and cannot go until he is feeling better.  He denies any fever, shortness of breath, or edema.     No Known Allergies  Immunization History  Administered Date(s) Administered  . Hep A / Hep B 03/11/2018  . Influenza Split 02/04/2011  . Influenza Whole 05/22/2007, 06/22/2009  . Influenza-Unspecified 04/03/2014  . Typhoid Inactivated 03/11/2018  . Zoster Recombinat (Shingrix) 03/11/2018, 05/27/2018    Past Medical History:  Diagnosis Date  . Allergic rhinitis   . Enlarged prostate   . Hyperlipidemia   . Prostate cancer ([place])   . Restless leg   . Snoring    NPSG NEG for OSA    Tobacco History: Social History   Tobacco Use  Smoking Status Never Smoker  Smokeless Tobacco Never Used   Counseling given: Not Answered   Outpatient Encounter Medications as of 06/04/2018  Medication Sig  . benzonatate (TESSALON) 200 MG capsule  Take 1 capsule (200 mg total) by mouth every 6 (six) hours as needed for cough.  . finasteride (PROPECIA) 1 MG tablet Take 1 mg by mouth daily. Pt unsure of dose  . fluticasone (FLONASE) 50 MCG/ACT nasal spray USE 2 SPRAYS IN EACH NOSTRIL ONCE DAILY  . HYDROcodone-homatropine (HYCODAN) 5-1.5 MG/5ML syrup Take 5 mLs by mouth every 6 (six) hours as needed for cough.  . montelukast (SINGULAIR) 10 MG tablet Take 1 tablet (10 mg total) by mouth at bedtime.  Marland Kitchen omeprazole (PRILOSEC) 20 MG capsule Take 1 capsule (20 mg total) by mouth 2 (two) times daily before a meal.  . rOPINIRole (REQUIP) 1 MG tablet Take 1 mg by mouth at bedtime.    . simvastatin (ZOCOR) 40 MG tablet Take 40 mg by mouth at bedtime.    . tamsulosin (FLOMAX) 0.4 MG CAPS capsule Take by mouth.  . moxifloxacin (AVELOX) 400 MG tablet Take 1 tablet (400 mg total) by mouth daily.  Marland Kitchen nystatin (MYCOSTATIN) 100000 UNIT/ML suspension Take 5 mLs (500,000 Units total) by mouth 4 (four) times daily.  . [EXPIRED] phenylephrine (NEO-SYNEPHRINE) 1 % nasal drops 1 drop    No facility-administered encounter medications on file as of 06/04/2018.      Review of Systems  Review of Systems  Constitutional: Negative.  Negative for chills and fever.  HENT: Positive for congestion, postnasal drip, sinus pressure, sinus pain and sore throat.   Respiratory: Positive for cough (  productive of green sputum). Negative for shortness of breath and wheezing.   Cardiovascular: Negative.  Negative for chest pain, palpitations and leg swelling.  Gastrointestinal: Negative.   Allergic/Immunologic: Negative.   Neurological: Negative.   Psychiatric/Behavioral: Negative.        Physical Exam  BP 120/70 (BP Location: Left Arm, Patient Position: Sitting, Cuff Size: Normal)   Pulse 93   Temp 98.3 F (36.8 C)   Ht 5\' 8"  (1.727 m)   Wt 177 lb 3.2 oz (80.4 kg)   SpO2 97%   BMI 26.94 kg/m   Wt Readings from Last 5 Encounters:  06/04/18 177 lb 3.2 oz (80.4  kg)  05/28/18 177 lb (80.3 kg)  07/04/14 182 lb 9.6 oz (82.8 kg)  02/04/11 175 lb 12.8 oz (79.7 kg)     Physical Exam Vitals signs and nursing note reviewed.  Constitutional:      General: He is not in acute distress.    Appearance: He is well-developed.  HENT:     Nose:     Right Sinus: Maxillary sinus tenderness and frontal sinus tenderness present.     Left Sinus: Maxillary sinus tenderness and frontal sinus tenderness present.     Mouth/Throat:     Comments: Oral thrush noted  Cardiovascular:     Rate and Rhythm: Normal rate and regular rhythm.  Pulmonary:     Effort: Pulmonary effort is normal. No respiratory distress.     Breath sounds: Normal breath sounds. No wheezing or rhonchi.  Musculoskeletal:        General: No swelling.  Skin:    General: Skin is warm and dry.  Neurological:     Mental Status: He is alert and oriented to person, place, and time.     Imaging: Dg Chest 2 View  Result Date: 06/04/2018 CLINICAL DATA:  Productive cough for 12 weeks, intermittent shortness of breath EXAM: CHEST - 2 VIEW COMPARISON:  04/07/2018 FINDINGS: Normal heart size, mediastinal contours, and pulmonary vascularity. Atherosclerotic calcification aorta. Lungs clear. No pleural effusion or pneumothorax. RIGHT glenohumeral degenerative changes. IMPRESSION: No acute abnormalities. Electronically Signed   By: Lavonia Dana M.D.   On: 06/04/2018 16:11     Assessment & Plan:   Sinusitis Patient is having sinus congestion, pressure, and pain. He does have a hoarse voice today. Will treat for sinusitis. He does have postnasal drip. His throat did looking patchy - will treat for thrush.  Patient Instructions  Neo synephrine nasal neb given in office today Will check chest x ray and call with results Take mucinex twice daily May start Claritin daily Will order nystatin swish and spit Will order Avelox Will place ENT referral  Cough: Continue gastroesophageal reflux disease  treatment with elevating the head your bed and taking antacids Continue taking over-the-counter antihistamines and nasal fluticasone to help with allergic rhinitis You need to try to suppress your cough to allow your larynx (voice box) to heal.  For three days don't talk, laugh, sing, or clear your throat. Do everything you can to suppress the cough during this time. Use hard candies (sugarless Jolly Ranchers) or non-mint or non-menthol containing cough drops during this time to soothe your throat.  Use a cough suppressant (Delsym or what we have prescribed you) around the clock during this time.  After three days, gradually increase the use of your voice and back off on the cough suppressants.  Follow up with Dr. Lamonte Sakai at his first available appointment with PFT before visit  Chronic cough Will check chest x ray today and call with results. Will plan for PFT in the next 2-3 weeks.  Patient Instructions  Neo synephrine nasal neb given in office today Will check chest x ray and call with results Take mucinex twice daily May start Claritin daily Will order nystatin swish and spit Will order Avelox Will place ENT referral  Cough: Continue gastroesophageal reflux disease treatment with elevating the head your bed and taking antacids Continue taking over-the-counter antihistamines and nasal fluticasone to help with allergic rhinitis You need to try to suppress your cough to allow your larynx (voice box) to heal.  For three days don't talk, laugh, sing, or clear your throat. Do everything you can to suppress the cough during this time. Use hard candies (sugarless Jolly Ranchers) or non-mint or non-menthol containing cough drops during this time to soothe your throat.  Use a cough suppressant (Delsym or what we have prescribed you) around the clock during this time.  After three days, gradually increase the use of your voice and back off on the cough suppressants.  Follow up with Dr.  Lamonte Sakai at his first available appointment with PFT before visit            Fenton Foy, NP 06/05/2018

## 2018-06-04 NOTE — Patient Instructions (Addendum)
Neo synephrine nasal neb given in office today Will check chest x ray and call with results Take mucinex twice daily May start Claritin daily Will order nystatin swish and spit Will order Avelox Will place ENT referral  Cough: Continue gastroesophageal reflux disease treatment with elevating the head your bed and taking antacids Continue taking over-the-counter antihistamines and nasal fluticasone to help with allergic rhinitis You need to try to suppress your cough to allow your larynx (voice box) to heal.  For three days don't talk, laugh, sing, or clear your throat. Do everything you can to suppress the cough during this time. Use hard candies (sugarless Jolly Ranchers) or non-mint or non-menthol containing cough drops during this time to soothe your throat.  Use a cough suppressant (Delsym or what we have prescribed you) around the clock during this time.  After three days, gradually increase the use of your voice and back off on the cough suppressants.  Follow up with Dr. Lamonte Sakai at his first available appointment with PFT before visit

## 2018-06-05 ENCOUNTER — Ambulatory Visit (INDEPENDENT_AMBULATORY_CARE_PROVIDER_SITE_OTHER): Payer: Medicare Other | Admitting: Emergency Medicine

## 2018-06-05 ENCOUNTER — Encounter: Payer: Self-pay | Admitting: Nurse Practitioner

## 2018-06-05 DIAGNOSIS — R053 Chronic cough: Secondary | ICD-10-CM

## 2018-06-05 DIAGNOSIS — R05 Cough: Secondary | ICD-10-CM

## 2018-06-05 DIAGNOSIS — J329 Chronic sinusitis, unspecified: Secondary | ICD-10-CM

## 2018-06-05 LAB — PULMONARY FUNCTION TEST
DL/VA % pred: 105 %
DL/VA: 4.72 ml/min/mmHg/L
DLCO unc % pred: 88 %
DLCO unc: 26.14 ml/min/mmHg
FEF 25-75 Post: 3.74 L/sec
FEF 25-75 Pre: 3.16 L/sec
FEF2575-%Change-Post: 18 %
FEF2575-%Pred-Post: 163 %
FEF2575-%Pred-Pre: 138 %
FEV1-%CHANGE-POST: 4 %
FEV1-%PRED-POST: 102 %
FEV1-%Pred-Pre: 98 %
FEV1-Post: 3.08 L
FEV1-Pre: 2.95 L
FEV1FVC-%CHANGE-POST: 1 %
FEV1FVC-%Pred-Pre: 110 %
FEV6-%Change-Post: 2 %
FEV6-%PRED-PRE: 93 %
FEV6-%Pred-Post: 95 %
FEV6-POST: 3.69 L
FEV6-PRE: 3.62 L
FEV6FVC-%Change-Post: -1 %
FEV6FVC-%Pred-Post: 105 %
FEV6FVC-%Pred-Pre: 106 %
FVC-%Change-Post: 3 %
FVC-%PRED-PRE: 88 %
FVC-%Pred-Post: 91 %
FVC-PRE: 3.62 L
FVC-Post: 3.73 L
Post FEV1/FVC ratio: 83 %
Post FEV6/FVC ratio: 99 %
Pre FEV1/FVC ratio: 81 %
Pre FEV6/FVC Ratio: 100 %
RV % pred: 148 %
RV: 3.48 L
TLC % pred: 112 %
TLC: 7.43 L

## 2018-06-05 NOTE — Assessment & Plan Note (Signed)
Will check chest x ray today and call with results. Will plan for PFT in the next 2-3 weeks.  Patient Instructions  Neo synephrine nasal neb given in office today Will check chest x ray and call with results Take mucinex twice daily May start Claritin daily Will order nystatin swish and spit Will order Avelox Will place ENT referral  Cough: Continue gastroesophageal reflux disease treatment with elevating the head your bed and taking antacids Continue taking over-the-counter antihistamines and nasal fluticasone to help with allergic rhinitis You need to try to suppress your cough to allow your larynx (voice box) to heal.  For three days don't talk, laugh, sing, or clear your throat. Do everything you can to suppress the cough during this time. Use hard candies (sugarless Jolly Ranchers) or non-mint or non-menthol containing cough drops during this time to soothe your throat.  Use a cough suppressant (Delsym or what we have prescribed you) around the clock during this time.  After three days, gradually increase the use of your voice and back off on the cough suppressants.  Follow up with Dr. Lamonte Sakai at his first available appointment with PFT before visit

## 2018-06-05 NOTE — Assessment & Plan Note (Signed)
Patient is having sinus congestion, pressure, and pain. He does have a hoarse voice today. Will treat for sinusitis. He does have postnasal drip. His throat did looking patchy - will treat for thrush.  Patient Instructions  Neo synephrine nasal neb given in office today Will check chest x ray and call with results Take mucinex twice daily May start Claritin daily Will order nystatin swish and spit Will order Avelox Will place ENT referral  Cough: Continue gastroesophageal reflux disease treatment with elevating the head your bed and taking antacids Continue taking over-the-counter antihistamines and nasal fluticasone to help with allergic rhinitis You need to try to suppress your cough to allow your larynx (voice box) to heal.  For three days don't talk, laugh, sing, or clear your throat. Do everything you can to suppress the cough during this time. Use hard candies (sugarless Jolly Ranchers) or non-mint or non-menthol containing cough drops during this time to soothe your throat.  Use a cough suppressant (Delsym or what we have prescribed you) around the clock during this time.  After three days, gradually increase the use of your voice and back off on the cough suppressants.  Follow up with Dr. Lamonte Sakai at his first available appointment with PFT before visit

## 2018-06-05 NOTE — Progress Notes (Signed)
Full PFT performed today. °

## 2018-06-10 ENCOUNTER — Encounter: Payer: Self-pay | Admitting: Emergency Medicine

## 2018-06-10 ENCOUNTER — Ambulatory Visit: Payer: Medicare Other | Admitting: Emergency Medicine

## 2018-06-10 DIAGNOSIS — R053 Chronic cough: Secondary | ICD-10-CM

## 2018-06-10 DIAGNOSIS — Z23 Encounter for immunization: Secondary | ICD-10-CM | POA: Diagnosis not present

## 2018-06-10 DIAGNOSIS — R05 Cough: Secondary | ICD-10-CM | POA: Diagnosis not present

## 2018-06-10 NOTE — Progress Notes (Signed)
Subjective:    Patient ID: Jon Ewing, male    DOB: 07/23/1945, 73 y.o.   MRN: [id number]  HPI 73 year old man, never smoker with history of obstructive sleep apnea, restless leg syndrome chronic allergic rhinitis.  Has a history of prostate cancer, a deviated septum and nasal surgery.  He has been on multiple allergy medications in the past including cromolyn spray, nasal steroid, Veramyst.  He is currently on flonase.  His IgE in 2016 was 57 with a negative serological allergy profile.  He is on omeprazole 20 mg once daily.  He is here today with a persistent cough, started about 11 weeks ago after his wife had a URI. Started as a barking cough, dry. Usually during the day. He was treated for bronchitis w abx in Nov, no response. Then again two weeks later. No real change. Received prednisone in late Nov - did lighten the cough some, but still occurring. Cough syrup with hydrocodone helped also. Started omeprazole late December. He was he has developed head congestion more recently, over the last month. Then started sneezing a few days ago. He has started having colored nasal mucous.   ROV 06/10/18 --this is a follow-up visit for chronic cough in a never smoker with a history of obstructive sleep apnea, chronic allergic rhinitis, prostate cancer, deviated septum and nasal surgery.  At his last visit I tried aggressively treating nasal allergies, increased his omeprazole empirically to 20 mg twice a day.  I gave him a brief prednisone burst, Tessalon, Hycodan for cough suppression.  He was seen a week later with persistent symptoms.  He was treated with Avelox, nystatin and referred to ENT.  Pulmonary function testing was done 06/05/2018 and I reviewed.  This shows normal airflows without a bronchodilator response, borderline hyperinflation, normal diffusion capacity.  He saw Dr. Wilburn Cornelia with a ENT today, laryngoscopy showed dry nasal crusting, bilateral mucopurulent discharge consistent with chronic  sinusitis, no masses or polyps normal vocal cord mobility without any anatomic abnormality.  There was some mild post glottic erythema consistent with reflux. He continued the Avelox, planning for 10 more days.      Review of Systems  Constitutional: Negative for fever and unexpected weight change.  HENT: Positive for congestion, postnasal drip, rhinorrhea and sinus pressure. Negative for dental problem, ear pain, nosebleeds, sneezing, sore throat and trouble swallowing.   Eyes: Negative for redness and itching.  Respiratory: Positive for cough, chest tightness, shortness of breath and wheezing.   Cardiovascular: Negative for palpitations and leg swelling.  Gastrointestinal: Negative for nausea and vomiting.  Genitourinary: Negative for dysuria.  Musculoskeletal: Negative for joint swelling.  Skin: Negative for rash.  Neurological: Positive for headaches.  Hematological: Does not bruise/bleed easily.  Psychiatric/Behavioral: Negative for dysphoric mood. The patient is not nervous/anxious.    Past Medical History:  Diagnosis Date  . Allergic rhinitis   . Enlarged prostate   . Hyperlipidemia   . Prostate cancer ([place])   . Restless leg   . Snoring    NPSG NEG for OSA     Family History  Problem Relation Age of Onset  . Stroke Father      Social History   Socioeconomic History  . Marital status: Married    Spouse name: Not on file  . Number of children: 3  . Years of education: Not on file  . Highest education level: Not on file  Occupational History  . Occupation: retired  Scientific laboratory technician  . Financial resource strain:  Not on file  . Food insecurity:    Worry: Not on file    Inability: Not on file  . Transportation needs:    Medical: Not on file    Non-medical: Not on file  Tobacco Use  . Smoking status: Never Smoker  . Smokeless tobacco: Never Used  Substance and Sexual Activity  . Alcohol use: Yes    Alcohol/week: 0.0 standard drinks    Comment: 2 alcoholic  beverages daily  . Drug use: No  . Sexual activity: Not on file  Lifestyle  . Physical activity:    Days per week: Not on file    Minutes per session: Not on file  . Stress: Not on file  Relationships  . Social connections:    Talks on phone: Not on file    Gets together: Not on file    Attends religious service: Not on file    Active member of club or organization: Not on file    Attends meetings of clubs or organizations: Not on file    Relationship status: Not on file  . Intimate partner violence:    Fear of current or ex partner: Not on file    Emotionally abused: Not on file    Physically abused: Not on file    Forced sexual activity: Not on file  Other Topics Concern  . Not on file  Social History Narrative   Insurance account manager for [employer]           No Known Allergies   Outpatient Medications Prior to Visit  Medication Sig Dispense Refill  . benzonatate (TESSALON) 200 MG capsule Take 1 capsule (200 mg total) by mouth every 6 (six) hours as needed for cough. 30 capsule 1  . finasteride (PROPECIA) 1 MG tablet Take 1 mg by mouth daily. Pt unsure of dose    . fluticasone (FLONASE) 50 MCG/ACT nasal spray USE 2 SPRAYS IN EACH NOSTRIL ONCE DAILY 16 g 0  . HYDROcodone-homatropine (HYCODAN) 5-1.5 MG/5ML syrup Take 5 mLs by mouth every 6 (six) hours as needed for cough. 120 mL 0  . montelukast (SINGULAIR) 10 MG tablet Take 1 tablet (10 mg total) by mouth at bedtime. 30 tablet 5  . moxifloxacin (AVELOX) 400 MG tablet Take 1 tablet (400 mg total) by mouth daily. 7 tablet 0  . nystatin (MYCOSTATIN) 100000 UNIT/ML suspension Take 5 mLs (500,000 Units total) by mouth 4 (four) times daily. 60 mL 0  . omeprazole (PRILOSEC) 20 MG capsule Take 1 capsule (20 mg total) by mouth 2 (two) times daily before a meal. 60 capsule 5  . rOPINIRole (REQUIP) 1 MG tablet Take 1 mg by mouth at bedtime.      . simvastatin (ZOCOR) 40 MG tablet Take 40 mg by mouth at bedtime.      .  tamsulosin (FLOMAX) 0.4 MG CAPS capsule Take by mouth.     No facility-administered medications prior to visit.         Objective:   Physical Exam Vitals:   06/10/18 1526  BP: (!) 144/78  Pulse: 94  SpO2: 97%   Gen: Pleasant, well-nourished, in no distress,  normal affect  ENT: No lesions,  mouth clear,  oropharynx clear, no postnasal drip, some congestion  Neck: No JVD, no stridor  Lungs: No use of accessory muscles, no crackles or wheezing on normal respiration, no wheeze on forced expiration  Cardiovascular: RRR, heart sounds normal, no murmur or gallops, no peripheral edema  Musculoskeletal: No deformities, no cyanosis or clubbing  Neuro: alert, awake, non focal  Skin: Warm, no lesions or rash      Assessment & Plan:  Chronic cough No evidence to support asthma on his pulmonary function testing.  Reassured him about this.  He continues to have breakthrough cough even with good therapy for his rhinitis and GERD.  He underwent laryngoscopy with Dr. Wilburn Cornelia today that was consistent with chronic sinusitis and is being continued on Avelox.  I am hopeful that treatment of the sinusitis will decrease the rhinitis burden, decrease the strain on his posterior pharynx and hopefully help him resolve the cough.  Continue cough suppression and continue his maintenance therapies as above.  I will follow-up with him and discussed with Dr. Wilburn Cornelia depending on his progress.  If he continues to have cough he may need bronchoscopy.  Baltazar Apo, MD, PhD 06/10/2018, 5:32 PM [facility] [phone] or if no answer [phone]

## 2018-06-10 NOTE — Patient Instructions (Signed)
Please continue current medications as you have been taking them. Continue Avelox as recommended by Dr. Wilburn Cornelia.  Follow with him next month as planned Follow with Dr. Lamonte Sakai in 1 month.  Depending on how your cough is progressing we may decide to discuss bronchoscopy.

## 2018-06-10 NOTE — Assessment & Plan Note (Signed)
No evidence to support asthma on his pulmonary function testing.  Reassured him about this.  He continues to have breakthrough cough even with good therapy for his rhinitis and GERD.  He underwent laryngoscopy with Dr. Wilburn Cornelia today that was consistent with chronic sinusitis and is being continued on Avelox.  I am hopeful that treatment of the sinusitis will decrease the rhinitis burden, decrease the strain on his posterior pharynx and hopefully help him resolve the cough.  Continue cough suppression and continue his maintenance therapies as above.  I will follow-up with him and discussed with Dr. Wilburn Cornelia depending on his progress.  If he continues to have cough he may need bronchoscopy.

## 2018-06-11 ENCOUNTER — Telehealth: Payer: Self-pay | Admitting: Emergency Medicine

## 2018-06-11 ENCOUNTER — Ambulatory Visit: Payer: Medicare Other | Admitting: Emergency Medicine

## 2018-06-11 NOTE — Telephone Encounter (Signed)
Spoke with the pt  He states that he is needing a letter for his travel agent stating the description of why he is having to cancel his trip He states that this was discussed at ov yesterday  Please advise thanks  He will pick up when ready Please advise thanks!

## 2018-06-12 ENCOUNTER — Encounter: Payer: Self-pay | Admitting: Emergency Medicine

## 2018-06-12 NOTE — Telephone Encounter (Signed)
Spoke with pt. He is aware that his letter is ready for pick up. Letter has been placed up front. Nothing further was needed.

## 2018-06-12 NOTE — Telephone Encounter (Signed)
I wrote the letter, have it printed out on Pod B

## 2018-06-12 NOTE — Progress Notes (Signed)
Letter completed. Will have the pt notified

## 2018-06-17 ENCOUNTER — Telehealth: Payer: Self-pay | Admitting: Emergency Medicine

## 2018-06-17 NOTE — Telephone Encounter (Signed)
Call made to patient, patient states he has COPD and sinus infection. He has 3 more doses of Avelox left and thinks he made need something else. Patient states he flying out of town tomorrow and wanted to be sure there were no other extra precautions he needed to take. I made patient aware typically there are not any flying precautions however I would send message to DOD for suggestions regarding antibiotic. Patient states the avelox has helped with his symptoms but he remains congested and his cough has not improved at all.   TP please advise on antibiotic as well as if patient needs to take any flying precautions. Thanks.

## 2018-06-17 NOTE — Telephone Encounter (Signed)
He may want to take mask on flight to protect himself from other germs . Good handwashing and normal precuations to avoid exposures to germs   For Sinusitis would finish abx as directed  Cont w/ sinus regimen with mucinex if needed for congestion  Saline nasal rinses will help as well.  Along with flonase.  With sinus infections he may experience more sinus pain and ear pressure while flying if he develops severe pain would recommend him to seek medical attention .   May try Afrin prior to flight 1 puff each nostril x 1 there and back .  This may help with nasal /sinus pressure if he is not allergic to this .  It is otc.   Please contact office for sooner follow up if symptoms do not improve or worsen or seek emergency care

## 2018-06-17 NOTE — Telephone Encounter (Signed)
Call made to patient, made aware of TP recommendations.   He may want to take mask on flight to protect himself from other germs . Good handwashing and normal precuations to avoid exposures to germs   For Sinusitis would finish abx as directed  Cont w/ sinus regimen with mucinex if needed for congestion  Saline nasal rinses will help as well.  Along with flonase.  With sinus infections he may experience more sinus pain and ear pressure while flying if he develops severe pain would recommend him to seek medical attention .   May try Afrin prior to flight 1 puff each nostril x 1 there and back .  This may help with nasal /sinus pressure if he is not allergic to this .  It is otc.   Please contact office for sooner follow up if symptoms do not improve or worsen or seek emergency care    Voiced understanding. Nothing further is needed at this time.

## 2018-07-06 DIAGNOSIS — C4491 Basal cell carcinoma of skin, unspecified: Secondary | ICD-10-CM

## 2018-07-06 HISTORY — DX: Basal cell carcinoma of skin, unspecified: C44.91

## 2018-07-07 ENCOUNTER — Encounter: Payer: Self-pay | Admitting: Adult Health

## 2018-07-07 ENCOUNTER — Ambulatory Visit: Payer: Medicare Other | Admitting: Adult Health

## 2018-07-07 DIAGNOSIS — R053 Chronic cough: Secondary | ICD-10-CM

## 2018-07-07 DIAGNOSIS — R05 Cough: Secondary | ICD-10-CM

## 2018-07-07 NOTE — Assessment & Plan Note (Signed)
Improved with treatment aimed at chronic sinusitis and GERD/cough prevention Continue with trigger prevention.  Cough control.  Follow-up with ENT for upcoming sinus surgery  Plan  Patient Instructions  Continue on Flonase 2 puffs daily .  Continue on Singulair 10mg  At bedtime  .  May use Delsym 2 tsp Twice daily  As needed  Cough . Tessalon Three times a day  As needed  Cough .  Avoid throat clearing , no mints, use sips of water to soothe throat.  Prilosec 20mg  Twice daily  For 2 weeks then if cough free may decrease to daily .  Follow up with Dr. Lamonte Sakai  Or Sheridan Gettel NP in 3 months and As needed   Best recovery wishes with upcoming sinus surgery .

## 2018-07-07 NOTE — Patient Instructions (Signed)
Continue on Flonase 2 puffs daily .  Continue on Singulair 10mg  At bedtime  .  May use Delsym 2 tsp Twice daily  As needed  Cough . Tessalon Three times a day  As needed  Cough .  Avoid throat clearing , no mints, use sips of water to soothe throat.  Prilosec 20mg  Twice daily  For 2 weeks then if cough free may decrease to daily .  Follow up with Dr. Lamonte Sakai  Or Darrick Greenlaw NP in 3 months and As needed   Best recovery wishes with upcoming sinus surgery .

## 2018-07-07 NOTE — Progress Notes (Signed)
@Patient  ID: Jon Ewing, male    DOB: May 19, 1946, 73 y.o.   MRN: [id number]  Chief Complaint  Patient presents with  . 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  Is status post nasal septal deviation/palatoplasty surgery.   07/07/2018 Follow up ; Chronic cough  Patient returns for a one-month follow-up.  Patient has been undergoing work-up for chronic cough for 5 months.  This is felt to be secondary to chronic sinusitis.  He has been referred to ENT.  And treated with aggressive antibiotics.  Patient says he is finally feeling better cough has resolved over the last 1 to 2 weeks.  He was seen by ENT this morning and due to ongoing chronic sinus problems is been recommended for sinus surgery.  Dr. Victorio Palm notes were reviewed. He was treated with cough suppression regimen with Delsym and Tessalon Perles along with Flonase Singulair and Prilosec. Patient says he has not had to use Delsym or Tessalon recently. He denies any fever chest pain orthopnea PND or increased leg swelling.   No Known Allergies  Immunization History  Administered Date(s) Administered  . Hep A / Hep B 03/11/2018  . Influenza Split 02/04/2011  . Influenza Whole 05/22/2007, 06/22/2009  . Influenza, High Dose Seasonal PF 06/10/2018  . Influenza-Unspecified 04/03/2014  . Typhoid Inactivated 03/11/2018  . Zoster Recombinat (Shingrix) 03/11/2018, 05/27/2018    Past Medical History:  Diagnosis Date  . Allergic rhinitis   . Enlarged prostate   . Hyperlipidemia   . Prostate cancer ([place])   . Restless leg   . Snoring    NPSG NEG for OSA    Tobacco History: Social History   Tobacco Use  Smoking Status Never Smoker  Smokeless Tobacco Never Used   Counseling given: Not Answered   Outpatient Medications Prior to Visit  Medication Sig Dispense Refill    . benzonatate (TESSALON) 200 MG capsule Take 1 capsule (200 mg total) by mouth every 6 (six) hours as needed for cough. 30 capsule 1  . finasteride (PROPECIA) 1 MG tablet Take 1 mg by mouth daily. Pt unsure of dose    . fluticasone (FLONASE) 50 MCG/ACT nasal spray USE 2 SPRAYS IN EACH NOSTRIL ONCE DAILY 16 g 0  . montelukast (SINGULAIR) 10 MG tablet Take 1 tablet (10 mg total) by mouth at bedtime. 30 tablet 5  . rOPINIRole (REQUIP) 1 MG tablet Take 1 mg by mouth at bedtime.      . simvastatin (ZOCOR) 40 MG tablet Take 40 mg by mouth at bedtime.      . tamsulosin (FLOMAX) 0.4 MG CAPS capsule Take by mouth.    Marland Kitchen HYDROcodone-homatropine (HYCODAN) 5-1.5 MG/5ML syrup Take 5 mLs by mouth every 6 (six) hours as needed for cough. (Patient not taking: Reported on 07/07/2018) 120 mL 0  . omeprazole (PRILOSEC) 20 MG capsule Take 1 capsule (20 mg total) by mouth 2 (two) times daily before a meal. (Patient not taking: Reported on 07/07/2018) 60 capsule 5  . moxifloxacin (AVELOX) 400 MG tablet Take 1 tablet (400 mg total) by mouth daily. (Patient not taking: Reported on 07/07/2018) 7 tablet 0  . nystatin (MYCOSTATIN) 100000 UNIT/ML suspension Take 5 mLs (500,000 Units total) by mouth 4 (four) times daily. (Patient not taking: Reported on 07/07/2018) 60 mL 0   No facility-administered medications prior to visit.  Review of Systems:   Constitutional:   No  weight loss, night sweats,  Fevers, chills, fatigue, or  lassitude.  HEENT:   No headaches,  Difficulty swallowing,  Tooth/dental problems, or  Sore throat,                No sneezing, itching, ear ache, +nasal congestion, post nasal drip, +throat clearing   CV:  No chest pain,  Orthopnea, PND, swelling in lower extremities, anasarca, dizziness, palpitations, syncope.   GI  No heartburn, indigestion, abdominal pain, nausea, vomiting, diarrhea, change in bowel habits, loss of appetite, bloody stools.   Resp: No shortness of breath with exertion or at  rest.  No excess mucus, no productive cough,  No non-productive cough,  No coughing up of blood.  No change in color of mucus.  No wheezing.  No chest wall deformity  Skin: no rash or lesions.  GU: no dysuria, change in color of urine, no urgency or frequency.  No flank pain, no hematuria   MS:  No joint pain or swelling.  No decreased range of motion.  No back pain.    Physical Exam  BP 114/60 (BP Location: Left Arm, Cuff Size: Normal)   Pulse 77   Ht 5\' 8"  (1.727 m)   Wt 178 lb 9.6 oz (81 kg)   SpO2 97%   BMI 27.16 kg/m   GEN: A/Ox3; pleasant , NAD, well nourished    HEENT:  [place]/AT,  EACs-clear, TMs-wnl, NOSE-clear drainage , THROAT-clear, no lesions, no postnasal drip or exudate noted.   NECK:  Supple w/ fair ROM; no JVD; normal carotid impulses w/o bruits; no thyromegaly or nodules palpated; no lymphadenopathy.    RESP  Clear  P & A; w/o, wheezes/ rales/ or rhonchi. no accessory muscle use, no dullness to percussion  CARD:  RRR, no m/r/g, no peripheral edema, pulses intact, no cyanosis or clubbing.  GI:   Soft & nt; nml bowel sounds; no organomegaly or masses detected.   Musco: Warm bil, no deformities or joint swelling noted.   Neuro: alert, no focal deficits noted.    Skin: Warm, no lesions or rashes    Lab Results:  CBC  BMET No results found for: NA, K, CL, CO2, GLUCOSE, BUN, CREATININE, CALCIUM, GFRNONAA, GFRAA  BNP No results found for: BNP  ProBNP No results found for: PROBNP  Imaging: No results found.  PFT Results Latest Ref Rng & Units 06/05/2018  FVC-Pre L 3.62  FVC-Predicted Pre % 88  FVC-Post L 3.73  FVC-Predicted Post % 91  Pre FEV1/FVC % % 81  Post FEV1/FCV % % 83  FEV1-Pre L 2.95  FEV1-Predicted Pre % 98  FEV1-Post L 3.08  DLCO UNC% % 88  DLCO COR %Predicted % 105  TLC L 7.43  TLC % Predicted % 112  RV % Predicted % 148    No results found for: NITRICOXIDE      Assessment & Plan:   Chronic cough Improved with  treatment aimed at chronic sinusitis and GERD/cough prevention Continue with trigger prevention.  Cough control.  Follow-up with ENT for upcoming sinus surgery  Plan  Patient Instructions  Continue on Flonase 2 puffs daily .  Continue on Singulair 10mg  At bedtime  .  May use Delsym 2 tsp Twice daily  As needed  Cough . Tessalon Three times a day  As needed  Cough .  Avoid throat clearing , no mints, use sips of water to soothe throat.  Prilosec 20mg   Twice daily  For 2 weeks then if cough free may decrease to daily .  Follow up with Dr. Lamonte Sakai  Or [place] NP in 3 months and As needed   Best recovery wishes with upcoming sinus surgery .          Rexene Edison, NP 07/07/2018

## 2018-07-09 ENCOUNTER — Other Ambulatory Visit: Payer: Self-pay | Admitting: Otolaryngology

## 2018-07-10 ENCOUNTER — Ambulatory Visit: Payer: Medicare Other | Admitting: Emergency Medicine

## 2018-07-22 ENCOUNTER — Encounter (HOSPITAL_COMMUNITY): Payer: Self-pay

## 2018-07-22 ENCOUNTER — Telehealth: Payer: Self-pay | Admitting: Emergency Medicine

## 2018-07-22 NOTE — Telephone Encounter (Signed)
Will route this over to [place]!

## 2018-07-22 NOTE — Telephone Encounter (Signed)
Form has been retrieved and filled out the best of my ability. It has been placed in RB's sign folder.

## 2018-07-22 NOTE — Pre-Procedure Instructions (Signed)
Jon Ewing  07/22/2018      [address] [place] [address] Phone: [phone] Fax: [phone]    Your procedure is scheduled on Friday 07/31/18  Report to [facility]. Entrance A  at  [place].M.  Call this number if you have problems the morning of surgery:  [phone]   Remember:  Do not eat or drink after midnight.     Take these medicines the morning of surgery with A SIP OF WATER         Fluticasone(flonase), , Loratadine(Claritin), Omeprazole(Prilosec)  Starting 07/24/18  STOP taking any Aspirin (unless otherwise instructed by your surgeon), Aleve, Naproxen, Ibuprofen, Motrin, Advil, Goody's, BC's, all herbal medications, fish oil, and all vitamins including  Glucosamine Chondroitin Triple tabs.   Do not wear jewelry  Do not wear lotions, powders,  colognes, or deodorant.  Do not shave 48 hours prior to surgery.  Men may shave face and neck.  Do not bring valuables to the hospital.  [facility] is not responsible for any belongings or valuables.  Contacts, dentures or bridgework may not be worn into surgery.  Leave your suitcase in the car.  After surgery it may be brought to your room.  For patients admitted to the hospital, discharge time will be determined by your treatment team.  Patients discharged the day of surgery will not be allowed to drive home.   Special instructions:  [place]- Preparing For Surgery  Before surgery, you can play an important role. Because skin is not sterile, your skin needs to be as free of germs as possible. You can reduce the number of germs on your skin by washing with CHG (chlorahexidine gluconate) Soap before surgery.  CHG is an antiseptic cleaner which kills germs and bonds with the skin to continue killing germs even after washing.    Oral Hygiene is also important to reduce your risk of infection.  Remember - BRUSH YOUR TEETH THE  MORNING OF SURGERY WITH YOUR REGULAR TOOTHPASTE  Please do not use if you have an allergy to CHG or antibacterial soaps. If your skin becomes reddened/irritated stop using the CHG.  Do not shave (including legs and underarms) for at least 48 hours prior to first CHG shower. It is OK to shave your face.  Please follow these instructions carefully.   1. Shower the NIGHT BEFORE SURGERY and the MORNING OF SURGERY with CHG.   2. If you chose to wash your hair, wash your hair first as usual with your normal shampoo.  3. After you shampoo, rinse your hair and body thoroughly to remove the shampoo.  4. Use CHG as you would any other liquid soap. You can apply CHG directly to the skin and wash gently with a scrungie or a clean washcloth.   5. Apply the CHG Soap to your body ONLY FROM THE NECK DOWN.  Do not use on open wounds or open sores. Avoid contact with your eyes, ears, mouth and genitals (private parts). Wash Face and genitals (private parts)  with your normal soap.  6. Wash thoroughly, paying special attention to the area where your surgery will be performed.  7. Thoroughly rinse your body with warm water from the neck down.  8. DO NOT shower/wash with your normal soap after using and rinsing off the CHG Soap.  9. Pat yourself dry with a CLEAN TOWEL.  10. Wear CLEAN PAJAMAS  to bed the night before surgery, wear comfortable clothes the morning of surgery  11. Place CLEAN SHEETS on your bed the night of your first shower and DO NOT SLEEP WITH PETS.    Day of Surgery:  Do not apply any deodorants/lotions.  Please wear clean clothes to the hospital/surgery center.   Remember to brush your teeth WITH YOUR REGULAR TOOTHPASTE.    Please read over the following fact sheets that you were given. Pain Booklet, Coughing and Deep Breathing and Surgical Site Infection Prevention

## 2018-07-23 ENCOUNTER — Encounter (HOSPITAL_COMMUNITY): Payer: Self-pay

## 2018-07-23 ENCOUNTER — Encounter (HOSPITAL_COMMUNITY)
Admission: RE | Admit: 2018-07-23 | Discharge: 2018-07-23 | Disposition: A | Discharge status: Home / Self Care | Payer: Medicare Other | Source: Ambulatory Visit | Attending: Otolaryngology | Admitting: Otolaryngology

## 2018-07-23 ENCOUNTER — Other Ambulatory Visit: Payer: Self-pay

## 2018-07-23 DIAGNOSIS — Z01812 Encounter for preprocedural laboratory examination: Secondary | ICD-10-CM | POA: Insufficient documentation

## 2018-07-23 HISTORY — DX: Cough, unspecified: R05.9

## 2018-07-23 HISTORY — DX: Cough: R05

## 2018-07-23 LAB — CBC
HCT: 41.8 % (ref 39.0–52.0)
Hemoglobin: 13.5 g/dL (ref 13.0–17.0)
MCH: 29.5 pg (ref 26.0–34.0)
MCHC: 32.3 g/dL (ref 30.0–36.0)
MCV: 91.3 fL (ref 80.0–100.0)
Platelets: 291 10*3/uL (ref 150–400)
RBC: 4.58 MIL/uL (ref 4.22–5.81)
RDW: 12.8 % (ref 11.5–15.5)
WBC: 5.3 10*3/uL (ref 4.0–10.5)
nRBC: 0 % (ref 0.0–0.2)

## 2018-07-23 LAB — BASIC METABOLIC PANEL
Anion gap: 5 (ref 5–15)
BUN: 9 mg/dL (ref 8–23)
CO2: 23 mmol/L (ref 22–32)
Calcium: 9.1 mg/dL (ref 8.9–10.3)
Chloride: 109 mmol/L (ref 98–111)
Creatinine, Ser: 0.88 mg/dL (ref 0.61–1.24)
GFR calc Af Amer: >60 mL/min (ref >60)
GFR calc non Af Amer: >60 mL/min (ref >60)
Glucose, Bld: 119 mg/dL — ABNORMAL HIGH (ref 70–99)
Potassium: 4 mmol/L (ref 3.5–5.1)
Sodium: 137 mmol/L (ref 135–145)

## 2018-07-23 MED ORDER — CHLORHEXIDINE GLUCONATE CLOTH 2 % EX PADS: 6.0000 | MEDICATED_PAD | Freq: Once | CUTANEOUS | Status: DC

## 2018-07-23 NOTE — Telephone Encounter (Signed)
Will await signature from [place]

## 2018-07-24 NOTE — Telephone Encounter (Signed)
Form has been filled out and signed by RB.  A message has been left on the pt's named voicemail letting him know that form is ready to be picked up per his request. Form has been placed up front for pick up.

## 2018-07-27 ENCOUNTER — Other Ambulatory Visit: Payer: Self-pay

## 2018-07-27 ENCOUNTER — Other Ambulatory Visit (HOSPITAL_COMMUNITY): Payer: Self-pay

## 2018-07-27 DIAGNOSIS — I358 Other nonrheumatic aortic valve disorders: Secondary | ICD-10-CM

## 2018-07-29 ENCOUNTER — Ambulatory Visit (HOSPITAL_COMMUNITY): Payer: Medicare Other | Attending: Cardiovascular Disease

## 2018-07-29 ENCOUNTER — Other Ambulatory Visit: Payer: Self-pay

## 2018-07-29 DIAGNOSIS — I358 Other nonrheumatic aortic valve disorders: Secondary | ICD-10-CM | POA: Diagnosis not present

## 2018-07-31 ENCOUNTER — Encounter (HOSPITAL_COMMUNITY): Payer: Self-pay

## 2018-07-31 ENCOUNTER — Ambulatory Visit (HOSPITAL_COMMUNITY): Payer: Medicare Other | Admitting: Anesthesiology

## 2018-07-31 ENCOUNTER — Ambulatory Visit (HOSPITAL_COMMUNITY)
Admission: RE | Admit: 2018-07-31 | Discharge: 2018-07-31 | Disposition: A | Discharge status: Home / Self Care | Payer: Medicare Other | Attending: Otolaryngology | Admitting: Otolaryngology

## 2018-07-31 ENCOUNTER — Encounter (HOSPITAL_COMMUNITY)
Admission: RE | Disposition: A | Discharge status: Home / Self Care | Payer: Self-pay | Source: Home / Self Care | Attending: Otolaryngology

## 2018-07-31 ENCOUNTER — Other Ambulatory Visit: Payer: Self-pay

## 2018-07-31 DIAGNOSIS — J343 Hypertrophy of nasal turbinates: Secondary | ICD-10-CM | POA: Diagnosis not present

## 2018-07-31 DIAGNOSIS — N400 Enlarged prostate without lower urinary tract symptoms: Secondary | ICD-10-CM | POA: Diagnosis not present

## 2018-07-31 DIAGNOSIS — G2581 Restless legs syndrome: Secondary | ICD-10-CM | POA: Diagnosis not present

## 2018-07-31 DIAGNOSIS — J329 Chronic sinusitis, unspecified: Secondary | ICD-10-CM | POA: Diagnosis present

## 2018-07-31 DIAGNOSIS — Z8546 Personal history of malignant neoplasm of prostate: Secondary | ICD-10-CM | POA: Insufficient documentation

## 2018-07-31 DIAGNOSIS — Z79899 Other long term (current) drug therapy: Secondary | ICD-10-CM | POA: Diagnosis not present

## 2018-07-31 DIAGNOSIS — E785 Hyperlipidemia, unspecified: Secondary | ICD-10-CM | POA: Diagnosis not present

## 2018-07-31 HISTORY — PX: SINUS ENDO WITH FUSION: SHX5329

## 2018-07-31 HISTORY — PX: TURBINATE REDUCTION: SHX6157

## 2018-07-31 HISTORY — PX: SINUS ENDO W/FUSION: SHX777

## 2018-07-31 SURGERY — SINUS SURGERY, ENDOSCOPIC, USING COMPUTER-ASSISTED NAVIGATION
Anesthesia: General | Site: Nose | Laterality: Bilateral

## 2018-07-31 MED ORDER — PHENYLEPHRINE 40 MCG/ML (10ML) SYRINGE FOR IV PUSH (FOR BLOOD PRESSURE SUPPORT)
PREFILLED_SYRINGE | INTRAVENOUS | Status: AC
  Filled 2018-07-31: qty 10

## 2018-07-31 MED ORDER — ACETAMINOPHEN 500 MG PO TABS
1000.0000 mg | ORAL_TABLET | Freq: Once | ORAL | Status: AC
  Administered 2018-07-31: 1000 mg via ORAL
  Filled 2018-07-31: qty 2

## 2018-07-31 MED ORDER — DEXAMETHASONE SODIUM PHOSPHATE 10 MG/ML IJ SOLN
10.0000 mg | Freq: Once | INTRAMUSCULAR | Status: AC
  Administered 2018-07-31: 5 mg via INTRAVENOUS
  Filled 2018-07-31: qty 1

## 2018-07-31 MED ORDER — ONDANSETRON HCL 4 MG/2ML IJ SOLN
INTRAMUSCULAR | Status: AC
  Filled 2018-07-31: qty 2

## 2018-07-31 MED ORDER — ONDANSETRON HCL 4 MG/2ML IJ SOLN
INTRAMUSCULAR | Status: DC | PRN
  Administered 2018-07-31: 4 mg via INTRAVENOUS

## 2018-07-31 MED ORDER — TRIAMCINOLONE ACETONIDE 40 MG/ML IJ SUSP
INTRAMUSCULAR | Status: AC
  Filled 2018-07-31: qty 5

## 2018-07-31 MED ORDER — LACTATED RINGERS IV SOLN
INTRAVENOUS | Status: DC
  Administered 2018-07-31 (×2): via INTRAVENOUS

## 2018-07-31 MED ORDER — SCOPOLAMINE 1 MG/3DAYS TD PT72
1.0000 | MEDICATED_PATCH | TRANSDERMAL | Status: DC
  Administered 2018-07-31: 1.5 mg via TRANSDERMAL
  Filled 2018-07-31: qty 1

## 2018-07-31 MED ORDER — EPHEDRINE SULFATE-NACL 50-0.9 MG/10ML-% IV SOSY
PREFILLED_SYRINGE | INTRAVENOUS | Status: DC | PRN
  Administered 2018-07-31 (×2): 10 mg via INTRAVENOUS
  Administered 2018-07-31: 5 mg via INTRAVENOUS

## 2018-07-31 MED ORDER — LIDOCAINE 2% (20 MG/ML) 5 ML SYRINGE
INTRAMUSCULAR | Status: AC
  Filled 2018-07-31: qty 5

## 2018-07-31 MED ORDER — ROCURONIUM BROMIDE 50 MG/5ML IV SOSY
PREFILLED_SYRINGE | INTRAVENOUS | Status: AC
  Filled 2018-07-31: qty 10

## 2018-07-31 MED ORDER — OXYMETAZOLINE HCL 0.05 % NA SOLN
NASAL | Status: DC | PRN
  Administered 2018-07-31: 1

## 2018-07-31 MED ORDER — PHENYLEPHRINE 40 MCG/ML (10ML) SYRINGE FOR IV PUSH (FOR BLOOD PRESSURE SUPPORT)
PREFILLED_SYRINGE | INTRAVENOUS | Status: DC | PRN
  Administered 2018-07-31 (×3): 80 ug via INTRAVENOUS

## 2018-07-31 MED ORDER — PROPOFOL 10 MG/ML IV BOLUS
INTRAVENOUS | Status: AC
  Filled 2018-07-31: qty 20

## 2018-07-31 MED ORDER — LIDOCAINE-EPINEPHRINE 1 %-1:100000 IJ SOLN
INTRAMUSCULAR | Status: DC | PRN
  Administered 2018-07-31: 5 mL

## 2018-07-31 MED ORDER — PROMETHAZINE HCL 25 MG/ML IJ SOLN: 6.2500 mg | INTRAMUSCULAR | Status: DC | PRN

## 2018-07-31 MED ORDER — MUPIROCIN 2 % EX OINT
TOPICAL_OINTMENT | CUTANEOUS | Status: AC
  Filled 2018-07-31: qty 22

## 2018-07-31 MED ORDER — DEXAMETHASONE SODIUM PHOSPHATE 10 MG/ML IJ SOLN
INTRAMUSCULAR | Status: AC
  Filled 2018-07-31: qty 1

## 2018-07-31 MED ORDER — FENTANYL CITRATE (PF) 100 MCG/2ML IJ SOLN
INTRAMUSCULAR | Status: DC | PRN
  Administered 2018-07-31: 100 ug via INTRAVENOUS

## 2018-07-31 MED ORDER — CEFAZOLIN SODIUM-DEXTROSE 2-4 GM/100ML-% IV SOLN
2.0000 g | INTRAVENOUS | Status: AC
  Administered 2018-07-31: 2 g via INTRAVENOUS
  Filled 2018-07-31: qty 100

## 2018-07-31 MED ORDER — MUPIROCIN CALCIUM 2 % EX CREA
TOPICAL_CREAM | CUTANEOUS | Status: AC
  Filled 2018-07-31: qty 15

## 2018-07-31 MED ORDER — SUGAMMADEX SODIUM 200 MG/2ML IV SOLN
INTRAVENOUS | Status: DC | PRN
  Administered 2018-07-31 (×2): 100 mg via INTRAVENOUS

## 2018-07-31 MED ORDER — OXYMETAZOLINE HCL 0.05 % NA SOLN
1.0000 | Freq: Two times a day (BID) | NASAL | Status: DC
  Administered 2018-07-31: 2 via NASAL
  Filled 2018-07-31: qty 30

## 2018-07-31 MED ORDER — LIDOCAINE-EPINEPHRINE 1 %-1:100000 IJ SOLN
INTRAMUSCULAR | Status: AC
  Filled 2018-07-31: qty 1

## 2018-07-31 MED ORDER — SODIUM CHLORIDE 0.9 % IR SOLN
Status: DC | PRN
  Administered 2018-07-31: 1000 mL

## 2018-07-31 MED ORDER — 0.9 % SODIUM CHLORIDE (POUR BTL) OPTIME
TOPICAL | Status: DC | PRN
  Administered 2018-07-31: 1000 mL

## 2018-07-31 MED ORDER — FENTANYL CITRATE (PF) 250 MCG/5ML IJ SOLN
INTRAMUSCULAR | Status: AC
  Filled 2018-07-31: qty 5

## 2018-07-31 MED ORDER — LIDOCAINE 2% (20 MG/ML) 5 ML SYRINGE
INTRAMUSCULAR | Status: DC | PRN
  Administered 2018-07-31: 80 mg via INTRAVENOUS

## 2018-07-31 MED ORDER — LEVOFLOXACIN 500 MG PO TABS: 500.0000 mg | ORAL_TABLET | Freq: Every day | ORAL | 0 refills | Status: DC

## 2018-07-31 MED ORDER — ROCURONIUM BROMIDE 50 MG/5ML IV SOSY
PREFILLED_SYRINGE | INTRAVENOUS | Status: DC | PRN
  Administered 2018-07-31: 80 mg via INTRAVENOUS

## 2018-07-31 MED ORDER — ARTIFICIAL TEARS OPHTHALMIC OINT
TOPICAL_OINTMENT | OPHTHALMIC | Status: DC | PRN
  Administered 2018-07-31: 1 via OPHTHALMIC

## 2018-07-31 MED ORDER — FENTANYL CITRATE (PF) 100 MCG/2ML IJ SOLN: 25.0000 ug | INTRAMUSCULAR | Status: DC | PRN

## 2018-07-31 MED ORDER — PROPOFOL 10 MG/ML IV BOLUS
INTRAVENOUS | Status: DC | PRN
  Administered 2018-07-31: 200 mg via INTRAVENOUS

## 2018-07-31 MED ORDER — OXYMETAZOLINE HCL 0.05 % NA SOLN
NASAL | Status: AC
  Filled 2018-07-31: qty 30

## 2018-07-31 SURGICAL SUPPLY — 54 items
ATTRACTOMAT 16X20 MAGNETIC DRP (DRAPES) IMPLANT
BLADE ROTATE RAD 40 4 M4 (BLADE) ×2 IMPLANT
BLADE ROTATE RAD 40 4MM M4 (BLADE) ×1
BLADE ROTATE TRICUT 4MX13CM M4 (BLADE) ×1
BLADE ROTATE TRICUT 4X13 M4 (BLADE) ×2 IMPLANT
BLADE SURG 15 STRL LF DISP TIS (BLADE) IMPLANT
BLADE SURG 15 STRL SS (BLADE)
CANISTER SUCT 3000ML PPV (MISCELLANEOUS) ×6 IMPLANT
COAGULATOR SUCT 6 FR SWTCH (ELECTROSURGICAL)
COAGULATOR SUCT 8FR VV (MISCELLANEOUS) IMPLANT
COAGULATOR SUCT SWTCH 10FR 6 (ELECTROSURGICAL) IMPLANT
COVER WAND RF STERILE (DRAPES) IMPLANT
DRAPE HALF SHEET 40X57 (DRAPES) IMPLANT
DRESSING NASAL KENNEDY 3.5X.9 (MISCELLANEOUS) IMPLANT
DRSG NASAL KENNEDY 3.5X.9 (MISCELLANEOUS)
ELECT COATED BLADE 2.86 ST (ELECTRODE) ×3 IMPLANT
ELECT REM PT RETURN 9FT ADLT (ELECTROSURGICAL) ×3
ELECTRODE REM PT RTRN 9FT ADLT (ELECTROSURGICAL) ×1 IMPLANT
FILTER ARTHROSCOPY CONVERTOR (FILTER) ×3 IMPLANT
FLUID NSS /IRRIG 1000 ML XXX (MISCELLANEOUS) ×3 IMPLANT
GAUZE SPONGE 2X2 8PLY STRL LF (GAUZE/BANDAGES/DRESSINGS) ×1 IMPLANT
GLOVE BIOGEL M 7.0 STRL (GLOVE) ×6 IMPLANT
GOWN STRL REUS W/ TWL LRG LVL3 (GOWN DISPOSABLE) ×2 IMPLANT
GOWN STRL REUS W/TWL LRG LVL3 (GOWN DISPOSABLE) ×4
KIT BASIN OR (CUSTOM PROCEDURE TRAY) ×3 IMPLANT
KIT TURNOVER KIT B (KITS) ×3 IMPLANT
NEEDLE 18GX1X1/2 (RX/OR ONLY) (NEEDLE) IMPLANT
NEEDLE HYPO 25GX1X1/2 BEV (NEEDLE) ×3 IMPLANT
NS IRRIG 1000ML POUR BTL (IV SOLUTION) ×3 IMPLANT
PAD ARMBOARD 7.5X6 YLW CONV (MISCELLANEOUS) ×6 IMPLANT
PENCIL BUTTON HOLSTER BLD 10FT (ELECTRODE) IMPLANT
SPECIMEN JAR SMALL (MISCELLANEOUS) ×3 IMPLANT
SPLINT NASAL DOYLE BI-VL (GAUZE/BANDAGES/DRESSINGS) IMPLANT
SPONGE GAUZE 2X2 STER 10/PKG (GAUZE/BANDAGES/DRESSINGS) ×2
SPONGE NEURO XRAY DETECT 1X3 (DISPOSABLE) ×3 IMPLANT
SUT ETHILON 3 0 FSL (SUTURE) IMPLANT
SUT ETHILON 3 0 PS 1 (SUTURE) IMPLANT
SUT PLAIN 4 0 ~~LOC~~ 1 (SUTURE) IMPLANT
SWAB COLLECTION DEVICE MRSA (MISCELLANEOUS) IMPLANT
SWAB CULTURE ESWAB REG 1ML (MISCELLANEOUS) IMPLANT
SYR CONTROL 10ML LL (SYRINGE) ×3 IMPLANT
TOWEL NATURAL 6PK STERILE (DISPOSABLE) ×3 IMPLANT
TOWEL OR 17X24 6PK STRL BLUE (TOWEL DISPOSABLE) ×3 IMPLANT
TRACKER ENT INSTRUMENT (MISCELLANEOUS) ×3 IMPLANT
TRACKER ENT PATIENT (MISCELLANEOUS) ×3 IMPLANT
TRAP SPECIMEN MUCOUS 40CC (MISCELLANEOUS) IMPLANT
TRAY ENT MC OR (CUSTOM PROCEDURE TRAY) ×3 IMPLANT
TUBE CONNECTING 12'X1/4 (SUCTIONS) ×1
TUBE CONNECTING 12X1/4 (SUCTIONS) ×2 IMPLANT
TUBE SALEM SUMP 16 FR W/ARV (TUBING) ×3 IMPLANT
TUBING EXTENTION W/L.L. (IV SETS) ×3 IMPLANT
TUBING STRAIGHTSHOT EPS 5PK (TUBING) ×3 IMPLANT
WATER STERILE IRR 1000ML POUR (IV SOLUTION) ×3 IMPLANT
WIPE INSTRUMENT VISIWIPE 73X73 (MISCELLANEOUS) ×3 IMPLANT

## 2018-07-31 NOTE — Anesthesia Postprocedure Evaluation (Signed)
Anesthesia Post Note  Patient: Jon Ewing  Procedure(s) Performed: ENDOSCOPIC SINUS SURGERY WITH NAVIGATION (Bilateral Nose) TURBINATE REDUCTION (Bilateral Nose) SINUS ENDO WITH FUSION (Bilateral Nose)     Patient location during evaluation: PACU Anesthesia Type: General Level of consciousness: sedated Pain management: pain level controlled Vital Signs Assessment: post-procedure vital signs reviewed and stable Respiratory status: spontaneous breathing and respiratory function stable Cardiovascular status: stable Postop Assessment: no apparent nausea or vomiting Anesthetic complications: no    Last Vitals:  Vitals:   07/31/18 1050 07/31/18 1105  BP: 132/76 135/72  Pulse: 76 78  Resp: 19 16  Temp:  (!) 36.3 C  SpO2: 94% 96%    Last Pain:  Vitals:   07/31/18 1105  TempSrc:   PainSc: 4                  Annaleah Arata DANIEL

## 2018-07-31 NOTE — Anesthesia Procedure Notes (Signed)
Procedure Name: Intubation Date/Time: 07/31/2018 9:01 AM Performed by: Trinna Post., CRNA Pre-anesthesia Checklist: Patient identified, Emergency Drugs available, Suction available, Patient being monitored and Timeout performed Patient Re-evaluated:Patient Re-evaluated prior to induction Oxygen Delivery Method: Circle system utilized Preoxygenation: Pre-oxygenation with 100% oxygen Induction Type: IV induction Ventilation: Mask ventilation without difficulty Laryngoscope Size: Mac and 4 Grade View: Grade II Tube type: Oral Tube size: 7.5 mm Number of attempts: 1 Airway Equipment and Method: Stylet Placement Confirmation: ETT inserted through vocal cords under direct vision,  positive ETCO2 and breath sounds checked- equal and bilateral Secured at: 22 cm Tube secured with: Tape Dental Injury: Teeth and Oropharynx as per pre-operative assessment

## 2018-07-31 NOTE — Anesthesia Preprocedure Evaluation (Signed)
Anesthesia Evaluation  Patient identified by MRN, date of birth, ID band Patient awake    Reviewed: Allergy & Precautions, NPO status , Patient's Chart, lab work & pertinent test results  Airway Mallampati: II  TM Distance: >3 FB Neck ROM: Full    Dental no notable dental hx. (+) Dental Advisory Given   Pulmonary neg pulmonary ROS,    Pulmonary exam normal        Cardiovascular negative cardio ROS Normal cardiovascular exam  IMPRESSIONS    1. The left ventricle has normal systolic function, with an ejection fraction of 55-60%. The cavity size was normal. Mild septal hypertrophy. Left ventricular diastolic Doppler parameters are consistent with impaired relaxation.  2. The right ventricle has normal systolic function. The cavity was normal. There is no increase in right ventricular wall thickness.  3. The aortic valve is tricuspid Mild thickening of the aortic valve Mild calcification of the aortic valve. Aortic valve regurgitation was not assessed by color flow Doppler.   Neuro/Psych negative neurological ROS  negative psych ROS   GI/Hepatic Neg liver ROS, GERD  ,  Endo/Other  negative endocrine ROS  Renal/GU negative Renal ROS  negative genitourinary   Musculoskeletal negative musculoskeletal ROS (+)   Abdominal   Peds negative pediatric ROS (+)  Hematology negative hematology ROS (+)   Anesthesia Other Findings   Reproductive/Obstetrics negative OB ROS                             Anesthesia Physical Anesthesia Plan  ASA: II  Anesthesia Plan: General   Post-op Pain Management:    Induction: Intravenous  PONV Risk Score and Plan: 3 and Ondansetron, Dexamethasone and Scopolamine patch - Pre-op  Airway Management Planned: Oral ETT  Additional Equipment:   Intra-op Plan:   Post-operative Plan: Extubation in OR  Informed Consent: I have reviewed the patients History and  Physical, chart, labs and discussed the procedure including the risks, benefits and alternatives for the proposed anesthesia with the patient or authorized representative who has indicated his/her understanding and acceptance.     Dental advisory given  Plan Discussed with: Anesthesiologist and CRNA  Anesthesia Plan Comments:         Anesthesia Quick Evaluation

## 2018-07-31 NOTE — Op Note (Signed)
Operative Note:  ENDOSCOPIC SINUS SURGERY WITH NAVIGATION    INFERIOR TURBINATE REDUCTION  Patient: Jon Ewing  Medical record number: [id number]  Date:07/31/2018  Pre-operative Indications: 1.  Chronic Sinusitis     2.  Bilateral inferior turbinate hypertrophy  Postoperative Indications: Same  Surgical Procedure: 1.  Bilateral endoscopic sinus surgery with intraoperative computer-assisted navigation (fusion) consisting of: Bilateral total ethmoidectomy, bilateral maxillary antrostomy with removal of diseased tissue and bilateral nasal frontal recess exploration    2.  Bilateral Inferior Turbinate Reduction  Anesthesia: GET  Surgeon: Delsa Bern, M.D.  Complications: None  EBL: 50 cc  Findings: Moderate mucosal edema with obstruction of the ostiomeatal complex and mucopurulent discharge in the middle meatus and maxillary sinuses bilaterally and bilateral inferior turbinate hypertrophy.  No nasal packing placed.   Brief History: The patient is a 73 y.o. male with a history of chronic sinusitis and turbinate hypertrophy. The patient has been on medical therapy to reduce nasal mucosal edema and infection including antibiotics, saline nasal spray and topical nasal steroids. Despite appropriate medical therapy the patient continues to have ongoing symptoms. Given the patient's history and findings, the above surgical procedures were recommended, risks and benefits were discussed in detail with the patient may understand and agree with our plan for surgery which is scheduled at at [facility] under general anesthesia as an outpatient.  Surgical Procedure: The patient is brought to the operating room on 07/31/2018 and placed in supine position on the operating table. General endotracheal anesthesia was established without difficulty. When the patient was adequately anesthetized, surgical timeout was performed with correct identification of the patient and the surgical procedure. The  patient's nose was then injected with 5 cc of 1% lidocaine 1:100,000 dilution epinephrine which was injected in a submucosal fashion. The patient's nose was then packed with Afrin-soaked cottonoid pledgets were left in place for approximately 10 minutes to allow for vasoconstriction and hemostasis.  The Xomed Fusion navigation headgear was applied in anatomic and surgical landmarks were identified and confirmed, navigation was used throughout the sinus component of the surgical procedure.  With the patient prepped draped and prepared for surgery, nasal nasal endoscopy was performed on the right side.  Using a 0 degree endoscope and a straight microdebrider, a total ethmoidectomy was performed dissecting from anterior to posterior along the floor of the ethmoid sinus removing bony septations and diseased mucosa.  Using a 45 degree telescope and a curved microdebrider dissection was then carried out along the roof of the ethmoid sinus with navigation, completing a total ethmoidectomy.  Attention was then turned to the nasal frontal recess and again using a curved microdebrider, navigation and endoscopic visualization the nasal frontal recess was widely open, underlying ethmoid cells and diseased mucosa resected creating a widely patent nasal frontal recess.  The lateral nasal wall was inspected, uncinate process was resected using a through-cutting forcep and the natural ostium of the maxillary sinus was enlarged in a posterior and inferior direction.  Within the maxillary sinus thick mucoid material and polypoid soft tissue was resected.    The patient's left side was then inspected and total ethmoidectomy was performed using a 0 degree telescope and straight microdebrider along the floor of the ethmoid sinus, a 45 degree telescope and curved microdebrider with navigation was used along the roof the ethmoid sinus from posterior to anterior to complete a total ethmoidectomy.  The nasal frontal recess was then  explored and underlying ethmoid disease was resected with a curved microdebrider,  the nasal frontal recess was widely opened and diseased material was resected from within the sinus.  Attention was then turned to the lateral nasal wall, the natural ostium maxillary sinus was identified. The uncinate process was resected.  Diseased mucosa from within the maxilla sinus was then cleared and the ostium was enlarged in a posterior and inferior direction.    Attention was then turned to the inferior turbinates, bilateral inferior turbinate intramural cautery was performed with cautery setting at 61 W.  2 submucosal passes were made in each inferior turbinate.  After completing cautery, anterior vertical incisions were created and overlying soft tissue was elevated, a small amount of turbinate bone was resected.  The turbinates were then outfractured to create a more patent nasal passageway.   Surgical sponge count was correct. An oral gastric tube was passed and the stomach contents were aspirated. Patient was awakened from anesthetic and transferred from the operating room to the recovery room in stable condition. There were no complications and blood loss was 50 cc.   Delsa Bern, M.D. [facility] 07/31/2018

## 2018-07-31 NOTE — H&P (Signed)
Jon Ewing is an 73 y.o. male.   Chief Complaint: Chronic Sinusitis HPI: HX of sinusitis and cough  Past Medical History:  Diagnosis Date  . Allergic rhinitis   . Cough   . Enlarged prostate   . Hyperlipidemia   . Prostate cancer ([place])   . Restless leg   . Snoring    NPSG NEG for OSA    Past Surgical History:  Procedure Laterality Date  . APPENDECTOMY    . NASAL TURBINATE REDUCTION    . Platoplasty    . SEPTOPLASTY     age 74  . TONSILLECTOMY      Family History  Problem Relation Age of Onset  . Stroke Father    Social History:  reports that he has never smoked. He has never used smokeless tobacco. He reports current alcohol use. He reports that he does not use drugs.  Allergies: No Known Allergies  Medications Prior to Admission  Medication Sig Dispense Refill  . finasteride (PROSCAR) 5 MG tablet Take 5 mg by mouth every evening.    . fluticasone (FLONASE) 50 MCG/ACT nasal spray USE 2 SPRAYS IN EACH NOSTRIL ONCE DAILY (Patient taking differently: Place 2 sprays into both nostrils every evening. USE 2 SPRAYS IN EACH NOSTRIL ONCE DAILY) 16 g 0  . ibuprofen (ADVIL,MOTRIN) 200 MG tablet Take 400 mg by mouth every 6 (six) hours as needed for headache or moderate pain.    Marland Kitchen loratadine (CLARITIN) 10 MG tablet Take 10 mg by mouth daily.    . Misc Natural Products (GLUCOSAMINE CHONDROITIN TRIPLE) TABS Take 1 tablet by mouth 2 (two) times daily.    . montelukast (SINGULAIR) 10 MG tablet Take 1 tablet (10 mg total) by mouth at bedtime. 30 tablet 5  . omeprazole (PRILOSEC) 20 MG capsule Take 1 capsule (20 mg total) by mouth 2 (two) times daily before a meal. (Patient taking differently: Take 20 mg by mouth 2 (two) times daily. ) 60 capsule 5  . rOPINIRole (REQUIP) 2 MG tablet Take 2 mg by mouth at bedtime.    . simvastatin (ZOCOR) 40 MG tablet Take 40 mg by mouth every evening.     . tamsulosin (FLOMAX) 0.4 MG CAPS capsule Take 0.8 mg by mouth every evening.     . benzonatate  (TESSALON) 200 MG capsule Take 1 capsule (200 mg total) by mouth every 6 (six) hours as needed for cough. (Patient not taking: Reported on 07/15/2018) 30 capsule 1  . HYDROcodone-homatropine (HYCODAN) 5-1.5 MG/5ML syrup Take 5 mLs by mouth every 6 (six) hours as needed for cough. (Patient not taking: Reported on 07/07/2018) 120 mL 0    No results found for this or any previous visit (from the past 48 hour(s)). No results found.  Review of Systems  Constitutional: Negative.   HENT: Positive for congestion and sinus pain.   Respiratory: Positive for cough.   Cardiovascular: Negative.     Blood pressure 140/79, pulse 75, temperature 97.8 F (36.6 C), temperature source Oral, resp. rate 18, height 5\' 8"  (1.727 m), weight 82.1 kg, SpO2 95 %. Physical Exam  Constitutional: He appears well-developed and well-nourished.  HENT:  Right Ear: External ear normal.  Left Ear: External ear normal.  Nose: Nose normal.  Neck: Normal range of motion. Neck supple.  Cardiovascular: Normal rate.  Respiratory: Effort normal.     Assessment/Plan Adm for B ESS and IT reduction as OP  Jerrell Belfast, MD 07/31/2018, 8:45 AM

## 2018-07-31 NOTE — Transfer of Care (Signed)
Immediate Anesthesia Transfer of Care Note  Patient: Jon Ewing  Procedure(s) Performed: ENDOSCOPIC SINUS SURGERY WITH NAVIGATION (Bilateral Nose) TURBINATE REDUCTION (Bilateral Nose) SINUS ENDO WITH FUSION (Bilateral Nose)  Patient Location: PACU  Anesthesia Type:General  Level of Consciousness: awake, alert  and oriented  Airway & Oxygen Therapy: Patient Spontanous Breathing and Patient connected to face mask oxygen  Post-op Assessment: Report given to RN and Post -op Vital signs reviewed and stable  Post vital signs: Reviewed and stable  Last Vitals:  Vitals Value Taken Time  BP    Temp    Pulse    Resp    SpO2      Last Pain:  Vitals:   07/31/18 0719  TempSrc:   PainSc: 0-No pain         Complications: No apparent anesthesia complications

## 2018-08-01 ENCOUNTER — Encounter (HOSPITAL_COMMUNITY): Payer: Self-pay | Admitting: Otolaryngology

## 2018-10-14 ENCOUNTER — Encounter: Payer: Self-pay | Admitting: Adult Health

## 2018-10-14 ENCOUNTER — Ambulatory Visit: Payer: Medicare Other | Admitting: Emergency Medicine

## 2018-10-14 ENCOUNTER — Other Ambulatory Visit: Payer: Self-pay

## 2018-10-14 ENCOUNTER — Ambulatory Visit (INDEPENDENT_AMBULATORY_CARE_PROVIDER_SITE_OTHER): Payer: Medicare Other | Admitting: Adult Health

## 2018-10-14 DIAGNOSIS — R05 Cough: Secondary | ICD-10-CM

## 2018-10-14 DIAGNOSIS — R053 Chronic cough: Secondary | ICD-10-CM

## 2018-10-14 NOTE — Patient Instructions (Signed)
Continue on Flonase 2 puffs daily .  Follow up with ENT as planned  Follow up with Dr. Lamonte Sakai  in 6 months and As needed

## 2018-10-14 NOTE — Progress Notes (Signed)
Virtual Visit via Telephone Note  I connected with Jon Ewing on 10/14/18 at 11:30 AM EDT by telephone and verified that I am speaking with the correct person using two identifiers.  Location: Patient: Home  Provider: Office    I discussed the limitations, risks, security and privacy concerns of performing an evaluation and management service by telephone and the availability of in person appointments. I also discussed with the patient that there may be a patient responsible charge related to this service. The patient expressed understanding and agreed to proceed.   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  Is status post nasal septal deviation/palatoplasty surgery.   Patient presents for a 59-month follow-up.  Patient is had an extensive work-up for chronic cough.  He has recently underwent sinus surgery with ENT in March.  Patient says since surgery he has been doing very well.  His cough is nearly resolved.  He is discontinued Singulair and omeprazole.  Has some occasional postnasal drainage.  He is using Flonase.  Patient denies any fever, discolored mucus chest pain orthopnea or edema.   Observations/Objective:  PFTs January 2020 were normal with no airflow obstruction or restriction normal DLCO  Assessment and Plan: Chronic cough secondary to chronic sinus disease.  Patient has significant improvement since sinus surgery in March of this year.  Seems to be doing very well.  No flare off of Singulair or omeprazole.  Continue with aggressive chronic rhinitis treatment.  Plan  Patient Instructions  Continue on Flonase 2 puffs daily .  Follow up with ENT as planned  Follow up with Dr. Lamonte Sakai  in 6 months and As needed          Follow Up Instructions: Follow up in 6 months and As needed      I discussed the assessment  and treatment plan with the patient. The patient was provided an opportunity to ask questions and all were answered. The patient agreed with the plan and demonstrated an understanding of the instructions.   The patient was advised to call back or seek an in-person evaluation if the symptoms worsen or if the condition fails to improve as anticipated.  I provided 12 minutes of non-face-to-face time during this encounter.   Rexene Edison, NP

## 2018-12-02 ENCOUNTER — Encounter: Payer: Self-pay | Admitting: *Deleted

## 2018-12-08 ENCOUNTER — Ambulatory Visit: Payer: Medicare Other | Admitting: Emergency Medicine

## 2018-12-08 ENCOUNTER — Other Ambulatory Visit: Payer: Self-pay

## 2018-12-08 ENCOUNTER — Encounter: Payer: Self-pay | Admitting: Emergency Medicine

## 2018-12-08 DIAGNOSIS — R05 Cough: Secondary | ICD-10-CM | POA: Diagnosis not present

## 2018-12-08 DIAGNOSIS — R053 Chronic cough: Secondary | ICD-10-CM

## 2018-12-08 MED ORDER — MONTELUKAST SODIUM 10 MG PO TABS: 10.0000 mg | ORAL_TABLET | Freq: Every day | ORAL | 5 refills | Status: DC

## 2018-12-08 NOTE — Addendum Note (Signed)
Addended by: Hildred Alamin I on: 12/08/2018 03:48 PM   Modules accepted: Orders

## 2018-12-08 NOTE — Assessment & Plan Note (Signed)
Much better after treatment of his chronic sinusitis by Dr. Wilburn Cornelia.  He still has some mild drainage and upper airway mucus, cough, throat clearing that he would like to try to eradicate.  He is on loratadine, Flonase, nasal saline rinses.  I will refer him to allergy, add Singulair back to see if he benefits.  If so he may be able to cancel the allergy referral.  Otherwise he may be a good candidate for immunotherapy.

## 2018-12-08 NOTE — Patient Instructions (Signed)
Please continue your loratadine 10 mg daily Please continue fluticasone nasal spray 2 sprays each nostril once daily. Continue your nasal saline rinses as you have been performing them. Try restarting Singulair 10 mg each evening for 1 month. We will make a referral to Allergy for allergy testing and possible immunotherapy if appropriate.  If you find that your congestion and cough improved significantly with the addition of Singulair, you could consider canceling the Allergy referral. Follow with Dr Lamonte Sakai as needed

## 2018-12-08 NOTE — Progress Notes (Signed)
Subjective:    Patient ID: Jon Ewing, male    DOB: Feb 05, 1946, 73 y.o.   MRN: [id number]  HPI  ROV 12/08/2018 --this a follow-up visit for 73 year old gentleman, never smoker for chronic cough.  He has a history of obstructive sleep apnea, allergic rhinitis, prostate cancer.  PFT 06/05/2018 reviewed by me, showed no evidence of obstructive lung disease with the exception of some hyperinflated volumes.  He underwent sinus surgery in 07/2018 for chronic sinusitis.  Also has a history of a nasal septal deviation palatoplasty surgery in the past.  He reports .  No longer on Singulair or omeprazole. He is having more cough, nasal congestion and drainage. He is on loratadine, flonase, saline washes.     Review of Systems  Constitutional: Negative for fever and unexpected weight change.  HENT: Positive for congestion, postnasal drip, rhinorrhea and sinus pressure. Negative for dental problem, ear pain, nosebleeds, sneezing, sore throat and trouble swallowing.   Eyes: Negative for redness and itching.  Respiratory: Positive for cough, chest tightness, shortness of breath and wheezing.   Cardiovascular: Negative for palpitations and leg swelling.  Gastrointestinal: Negative for nausea and vomiting.  Genitourinary: Negative for dysuria.  Musculoskeletal: Negative for joint swelling.  Skin: Negative for rash.  Neurological: Positive for headaches.  Hematological: Does not bruise/bleed easily.  Psychiatric/Behavioral: Negative for dysphoric mood. The patient is not nervous/anxious.    Past Medical History:  Diagnosis Date  . Allergic rhinitis   . BCC (basal cell carcinoma of skin) 07/06/2018   Left neck   . BCC (basal cell carcinoma) 03/01/2003   left inner cheek (CX35FU)  . BCC (basal cell carcinoma) 01/07/2006   left inner cheek (MOHS)  . BCC (basal cell carcinoma) 02/15/2009   left post scalp (CX35FU)  . BCC (basal cell carcinoma) 10/30/2009   left post neck (CX35FU)  . Cough   .  Enlarged prostate   . Hyperlipidemia   . 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 Prostate cancer ([place])   . Restless leg   . 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 Snoring    NPSG NEG for OSA     Family History  Problem Relation Age of Onset  . Stroke Father      Social History   Socioeconomic History  . Marital status: Married    Spouse name: Not on file  . Number of children: 3  . Years of education: Not on file  . Highest education level: Not on file  Occupational History  . Occupation: retired  Scientific laboratory technician  . Financial resource strain: Not on file  . Food insecurity    Worry: Not on file    Inability: Not on file  . Transportation needs    Medical: Not on file    Non-medical: Not on file  Tobacco Use  . Smoking status: Never Smoker  . Smokeless tobacco: Never Used  Substance and Sexual Activity  . Alcohol use: Yes    Alcohol/week: 0.0 standard drinks    Comment: 2 alcoholic beverages daily  . Drug use: No  . Sexual activity: Not on file  Lifestyle  . Physical activity    Days per week: Not on file    Minutes per session: Not on file  . Stress: Not on file  Relationships  . Social Herbalist on phone: Not on file    Gets together: Not on file  Attends religious service: Not on file    Active member of club or organization: Not on file    Attends meetings of clubs or organizations: Not on file    Relationship status: Not on file  . Intimate partner violence    Fear of current or ex partner: Not on file    Emotionally abused: Not on file    Physically abused: Not on file    Forced sexual activity: Not on file  Other Topics Concern  . Not on file  Social History Narrative   Insurance account manager for [employer]           No Known Allergies   Outpatient Medications Prior to Visit  Medication Sig Dispense Refill  . finasteride (PROSCAR) 5 MG tablet Take 5  mg by mouth every evening.    . fluticasone (FLONASE) 50 MCG/ACT nasal spray Place 2 sprays into both nostrils daily.    . Hypertonic Nasal Wash (SINUS RINSE NA) daily.    Marland Kitchen ibuprofen (ADVIL,MOTRIN) 200 MG tablet Take 400 mg by mouth every 6 (six) hours as needed for headache or moderate pain.    Marland Kitchen loratadine (CLARITIN) 10 MG tablet Take 10 mg by mouth daily.    . Misc Natural Products (GLUCOSAMINE CHONDROITIN TRIPLE) TABS Take 1 tablet by mouth 2 (two) times daily.    Marland Kitchen omeprazole (PRILOSEC) 20 MG capsule Take 1 capsule (20 mg total) by mouth 2 (two) times daily before a meal. 60 capsule 5  . rOPINIRole (REQUIP) 2 MG tablet Take 2 mg by mouth at bedtime.    . simvastatin (ZOCOR) 40 MG tablet Take 40 mg by mouth every evening.     . sodium chloride (OCEAN) 0.65 % SOLN nasal spray Place 1 spray into both nostrils 4 (four) times daily.    . tamsulosin (FLOMAX) 0.4 MG CAPS capsule Take 0.8 mg by mouth every evening.     . montelukast (SINGULAIR) 10 MG tablet Take 1 tablet (10 mg total) by mouth at bedtime. (Patient not taking: Reported on 10/14/2018) 30 tablet 5  . benzonatate (TESSALON) 200 MG capsule Take 1 capsule (200 mg total) by mouth every 6 (six) hours as needed for cough. (Patient not taking: Reported on 07/15/2018) 30 capsule 1  . HYDROcodone-homatropine (HYCODAN) 5-1.5 MG/5ML syrup Take 5 mLs by mouth every 6 (six) hours as needed for cough. (Patient not taking: Reported on 07/07/2018) 120 mL 0   No facility-administered medications prior to visit.         Objective:   Physical Exam Vitals:   12/08/18 1532  BP: 124/62  Pulse: 80  Temp: 98.1 F (36.7 C)  TempSrc: Oral  SpO2: 95%  Weight: 176 lb (79.8 kg)  Height: 5\' 8"  (1.727 m)   Gen: Pleasant, well-nourished, in no distress,  normal affect  ENT: No lesions,  mouth clear,  oropharynx clear, no postnasal drip, some congestion  Neck: No JVD, no stridor  Lungs: No use of accessory muscles, no crackles or wheezing on normal  respiration, no wheeze on forced expiration  Cardiovascular: RRR, heart sounds normal, no murmur or gallops, no peripheral edema  Musculoskeletal: No deformities, no cyanosis or clubbing  Neuro: alert, awake, non focal  Skin: Warm, no lesions or rash      Assessment & Plan:  Chronic cough Much better after treatment of his chronic sinusitis by Dr. Wilburn Cornelia.  He still has some mild drainage and upper airway mucus, cough, throat clearing that he would  like to try to eradicate.  He is on loratadine, Flonase, nasal saline rinses.  I will refer him to allergy, add Singulair back to see if he benefits.  If so he may be able to cancel the allergy referral.  Otherwise he may be a good candidate for immunotherapy.  Baltazar Apo, MD, PhD 12/08/2018, 3:46 PM  Pulmonary and Critical Care [phone] or if no answer [phone]

## 2018-12-18 ENCOUNTER — Telehealth: Payer: Self-pay | Admitting: Emergency Medicine

## 2018-12-18 DIAGNOSIS — R053 Chronic cough: Secondary | ICD-10-CM

## 2018-12-18 DIAGNOSIS — R05 Cough: Secondary | ICD-10-CM

## 2018-12-18 NOTE — Telephone Encounter (Signed)
Patient Instructions by Collene Gobble, MD at 12/08/2018 3:15 PM Author: Collene Gobble, MD Author Type: Physician Filed: 12/08/2018 3:45 PM  Note Status: Signed Cosign: Cosign Not Required Encounter Date: 12/08/2018  Editor: Collene Gobble, MD (Physician)    Please continue your loratadine 10 mg daily Please continue fluticasone nasal spray 2 sprays each nostril once daily. Continue your nasal saline rinses as you have been performing them. Try restarting Singulair 10 mg each evening for 1 month. We will make a referral to Allergy for allergy testing and possible immunotherapy if appropriate.  If you find that your congestion and cough improved significantly with the addition of Singulair, you could consider canceling the Allergy referral. Follow with Dr Lamonte Sakai as needed     Per pt's last OV, RB did say for pt to be referred to Allergy for allergy testing and possible immunotherapy. Checked recent orders and saw that no order had been placed. I have placed the referral order. Called and spoke with pt letting him know that this now has been done and pt verbalized understanding.  Nothing further needed.

## 2019-01-13 ENCOUNTER — Encounter: Payer: Self-pay | Admitting: Allergy

## 2019-01-13 ENCOUNTER — Ambulatory Visit (INDEPENDENT_AMBULATORY_CARE_PROVIDER_SITE_OTHER): Payer: Medicare Other | Admitting: Allergy

## 2019-01-13 ENCOUNTER — Other Ambulatory Visit: Payer: Self-pay

## 2019-01-13 ENCOUNTER — Telehealth: Payer: Self-pay | Admitting: *Deleted

## 2019-01-13 VITALS — BP 142/70 | HR 84 | Temp 98.0°F | Resp 16 | Ht 68.0 in | Wt 179.8 lb

## 2019-01-13 DIAGNOSIS — J310 Chronic rhinitis: Secondary | ICD-10-CM

## 2019-01-13 MED ORDER — AZELASTINE HCL 0.1 % NA SOLN: 1.0000 | Freq: Two times a day (BID) | NASAL | 5 refills | Status: DC

## 2019-01-13 MED ORDER — FLUTICASONE PROPIONATE 50 MCG/ACT NA SUSP: 1.0000 | Freq: Two times a day (BID) | NASAL | 5 refills | Status: DC

## 2019-01-13 MED ORDER — AZELASTINE-FLUTICASONE 137-50 MCG/ACT NA SUSP: 1.0000 | Freq: Two times a day (BID) | NASAL | 11 refills | Status: DC

## 2019-01-13 NOTE — Assessment & Plan Note (Signed)
Perennial rhinitis symptoms for 60+ years but worse in the last year.  Symptoms improved since sinus surgery in March 2020.  Tried over-the-counter antihistamines, Flonase with some benefit.  Singulair was ineffective.  Skin testing over 40 years ago was positive to dust mites per patient report.  Today's skin testing showed: Negative to environmental allergy panel but positive control was borderline questioning the validity of the results.  Get bloodwork to double check results. Will make additional recommendations based on results.   Start dymista 1 spray in each nostril twice a day.  This should help with the sinus drainage issues.  This replaces Flonase for now.  If your insurance does not cover this then let us know.   Nasal saline spray (i.e., Simply Saline) or nasal saline lavage (i.e., NeilMed) is recommended as needed and prior to medicated nasal sprays.  Continue Claritin for now.  Patient had issues with Zyrtec and urinary flow due to his history of prostate hypertrophy.  Stop Singulair as ineffective.

## 2019-01-13 NOTE — Telephone Encounter (Signed)
Janie with [facility] is requesting a return call regarding the prescription for Dymista. She is having a hard time getting the savings card to work and is wondering if it works with Information systems manager and medicaid patients. Please return her call at [phone].

## 2019-01-13 NOTE — Telephone Encounter (Signed)
Unfortunately the savings card will not work for [employer] patients. After speaking with Dr. Maudie Mercury patient already has Flonase and will need to have azelastine nasal spray 1 spray twice a day. Rx has been sent in. Called and informed patient of medication change. Patient verbalized understanding.

## 2019-01-13 NOTE — Progress Notes (Signed)
New Patient Note  RE: Jon Ewing MRN: [id number] DOB: 10/13/1945 Date of Office Visit: 01/13/2019  Referring provider: Collene Gobble, MD Primary care provider: Aurea Graff.Marlou Sa, MD  Chief Complaint: Post Nasal Drainage (started last September with a cough, persistent post nasal drainage, sniffling, some sneezing. He was diagnosed with chronic sinus infection and had sinus surgery in March 2020. This did help alleviate symptoms some but not completely. )  History of Present Illness: I had the pleasure of seeing [name] for initial evaluation at the [facility] [place] on 01/13/2019. He is a 73 y.o. male, who is referred here by Dr. Lamonte Sakai (pulm) for the evaluation of allergies.   Rhinitis:  He reports symptoms of rhinorrhea, PND, sneezing. Symptoms have been going on for 60+ years. The symptoms are present all year around but worsened last year. Other triggers include exposure to seasonal changes. Anosmia: no. Headache: no. He has used Flonase 2 sprays QD, loratadine, zyrtec with some improvement in symptoms. Adding Singulair did not help. Sinus infections: yes. Previous work up includes: 40 years ago was positive to dust and yeast per patient report. No previous AIT. Previous ENT evaluation: yes and had sinus surgery in March 2020 which helped.  History of nasal polyps: no.  Patient denies any SOB, chest tightness or wheezing. He was worked up by pulmonology and had normal PFTs. Tried inhaler in the fall with no benefit.  History of GERD and takes omeprazole.   Assessment and Plan: Clendon is a 73 y.o. male with: Chronic rhinitis Perennial rhinitis symptoms for 60+ years but worse in the last year.  Symptoms improved since sinus surgery in March 2020.  Tried over-the-counter antihistamines, Flonase with some benefit.  Singulair was ineffective.  Skin testing over 40 years ago was positive to dust mites per patient report.  Today's skin testing showed: Negative to  environmental allergy panel but positive control was borderline questioning the validity of the results.  Get bloodwork to double check results. Will make additional recommendations based on results.   Start dymista 1 spray in each nostril twice a day.  This should help with the sinus drainage issues.  This replaces Flonase for now.  If your insurance does not cover this then let us know.   Nasal saline spray (i.e., Simply Saline) or nasal saline lavage (i.e., NeilMed) is recommended as needed and prior to medicated nasal sprays.  Continue Claritin for now.  Patient had issues with Zyrtec and urinary flow due to his history of prostate hypertrophy.  Stop Singulair as ineffective.  Return in about 2 months (around 03/15/2019).  Meds ordered this encounter  Medications  . Azelastine-Fluticasone (DYMISTA) 137-50 MCG/ACT SUSP    Sig: Place 1 spray into the nose 2 (two) times daily.    Dispense:  23 g    Refill:  11    Use this coupon code: [id number]; [id number];  RxGRP:[id number]; Issuer:[id number]; [id number]    Lab Orders     Allergens w/Total IgE Area 2  Other allergy screening: Asthma: no Food allergy: no Medication allergy: no Hymenoptera allergy: large localized reaction Urticaria: no Eczema:no History of recurrent infections suggestive of immunodeficency: no  Diagnostics: Skin Testing: Environmental allergy panel.  Negative test to: environmental allergy panel but positive control was borderline.  Results discussed with patient/family. [facility] Adult Perc - 01/13/19 0912    Time Antigen Placed  0912    Allergen Manufacturer  Lavella Hammock    Location  Back    Number  of Test  59    Panel 1  Select    1. Control-Buffer 50% Glycerol  Negative    2. Control-Histamine 1 mg/ml  --   +/-   3. Albumin saline  Negative    4. Placedo  Negative    5. Guatemala  Negative    6. Johnson  Negative    7. Deadwood Blue  Negative    8. Meadow Fescue  Negative    9. Perennial Rye   Negative    10. Sweet Vernal  Negative    11. Timothy  Negative    12. Cocklebur  Negative    13. Burweed Marshelder  Negative    14. Ragweed, short  Negative    15. Ragweed, Giant  Negative    16. Plantain,  English  Negative    17. Lamb's Quarters  Negative    18. Sheep Sorrell  Negative    19. Rough Pigweed  Negative    20. Marsh Elder, Rough  Negative    21. Mugwort, Common  Negative    22. Ash mix  Negative    23. Birch mix  Negative    24. Beech American  Negative    25. Box, Elder  Negative    26. Cedar, red  Negative    27. Cottonwood, [place]  Negative    28. Elm mix  Negative    29. Hickory mix  Negative    30. Maple mix  Negative    31. Oak, [place] mix  Negative    32. Pecan Pollen  Negative    33. Pine mix  Negative    34. Sycamore Eastern  Negative    35. [place], Black Pollen  Negative    36. Alternaria alternata  Negative    37. Cladosporium Herbarum  Negative    38. Aspergillus mix  Negative    39. Penicillium mix  Negative    40. Bipolaris sorokiniana (Helminthosporium)  Negative    41. Drechslera spicifera (Curvularia)  Negative    42. Mucor plumbeus  Negative    43. Fusarium moniliforme  Negative    44. Aureobasidium pullulans (pullulara)  Negative    45. Rhizopus oryzae  Negative    46. Botrytis cinera  Negative    47. Epicoccum nigrum  Negative    48. Phoma betae  Negative    49. Candida Albicans  Negative    50. Trichophyton mentagrophytes  Negative    51. Mite, D Farinae  5,000 AU/ml  Negative    52. Mite, D Pteronyssinus  5,000 AU/ml  Negative    53. Cat Hair 10,000 BAU/ml  Negative    54.  Dog Epithelia  Negative    55. Mixed Feathers  Negative    56. Horse Epithelia  Negative    57. Cockroach, German  Negative    58. Mouse  Negative    59. Tobacco Leaf  Negative       Past Medical History: Patient Active Problem List   Diagnosis Date Noted  . Chronic rhinitis 01/13/2019  . Sinusitis, chronic 06/04/2018  . Chronic cough 05/28/2018   . HYPERLIPIDEMIA 06/22/2008  . Seasonal and perennial allergic rhinitis 02/28/2007  . SNORING 02/28/2007   Past Medical History:  Diagnosis Date  . Allergic rhinitis   . BCC (basal cell carcinoma of skin) 07/06/2018   Left neck   . BCC (basal cell carcinoma) 03/01/2003   left inner cheek (CX35FU)  . BCC (basal cell carcinoma) 01/07/2006   left  inner cheek (MOHS)  . BCC (basal cell carcinoma) 02/15/2009   left post scalp (CX35FU)  . BCC (basal cell carcinoma) 10/30/2009   left post neck (CX35FU)  . Cough   . Enlarged prostate   . Hyperlipidemia   . 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 Prostate cancer ([place])   . Restless leg   . 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 Snoring    NPSG NEG for OSA   Past Surgical History: Past Surgical History:  Procedure Laterality Date  . APPENDECTOMY    . NASAL TURBINATE REDUCTION    . Platoplasty    . SEPTOPLASTY     age 29  . SINOSCOPY    . SINUS ENDO W/FUSION Bilateral 07/31/2018   Procedure: ENDOSCOPIC SINUS SURGERY WITH NAVIGATION;  Surgeon: Jerrell Belfast, MD;  Location: [facility];  Service: ENT;  Laterality: Bilateral;  . SINUS ENDO WITH FUSION Bilateral 07/31/2018   Procedure: SINUS ENDO WITH FUSION;  Surgeon: Jerrell Belfast, MD;  Location: [facility];  Service: ENT;  Laterality: Bilateral;  . TONSILLECTOMY    . TURBINATE REDUCTION Bilateral 07/31/2018   Procedure: TURBINATE REDUCTION;  Surgeon: Jerrell Belfast, MD;  Location: [facility];  Service: ENT;  Laterality: Bilateral;   Medication List:  Current Outpatient Medications  Medication Sig Dispense Refill  . finasteride (PROSCAR) 5 MG tablet Take 5 mg by mouth every evening.    Marland Kitchen Hypertonic Nasal Wash (SINUS RINSE NA) daily.    Marland Kitchen ibuprofen (ADVIL,MOTRIN) 200 MG tablet Take 400 mg by mouth every 6 (six) hours as needed for headache or moderate pain.    Marland Kitchen loratadine (CLARITIN) 10 MG tablet Take 10 mg by mouth daily.     . Misc Natural Products (GLUCOSAMINE CHONDROITIN TRIPLE) TABS Take 1 tablet by mouth 2 (two) times daily.    Marland Kitchen omeprazole (PRILOSEC) 20 MG capsule Take 1 capsule (20 mg total) by mouth 2 (two) times daily before a meal. 60 capsule 5  . rOPINIRole (REQUIP) 2 MG tablet Take 2 mg by mouth at bedtime.    . simvastatin (ZOCOR) 40 MG tablet Take 40 mg by mouth every evening.     . sodium chloride (OCEAN) 0.65 % SOLN nasal spray Place 1 spray into both nostrils 4 (four) times daily.    . tamsulosin (FLOMAX) 0.4 MG CAPS capsule Take 0.8 mg by mouth every evening.     . Azelastine-Fluticasone (DYMISTA) 137-50 MCG/ACT SUSP Place 1 spray into the nose 2 (two) times daily. 23 g 11   No current facility-administered medications for this visit.    Allergies: No Known Allergies Social History: Social History   Socioeconomic History  . Marital status: Married    Spouse name: Not on file  . Number of children: 3  . Years of education: Not on file  . Highest education level: Not on file  Occupational History  . Occupation: retired  Scientific laboratory technician  . Financial resource strain: Not on file  . Food insecurity    Worry: Not on file    Inability: Not on file  . Transportation needs    Medical: Not on file    Non-medical: Not on file  Tobacco Use  . Smoking status: Never Smoker  . Smokeless tobacco: Never Used  Substance and Sexual Activity  . Alcohol use: Yes    Alcohol/week: 3.0 standard drinks    Types: 3 Standard drinks or equivalent per week  .  Drug use: No  . Sexual activity: Not on file  Lifestyle  . Physical activity    Days per week: Not on file    Minutes per session: Not on file  . Stress: Not on file  Relationships  . Social Herbalist on phone: Not on file    Gets together: Not on file    Attends religious service: Not on file    Active member of club or organization: Not on file    Attends meetings of clubs or organizations: Not on file    Relationship status: Not  on file  Other Topics Concern  . Not on file  Social History Narrative   Insurance account manager for [employer]         Lives in a 71 year old home. Smoking: denies Occupation: retired  Programme researcher, broadcasting/film/video HistoryFreight forwarder in the house: no Charity fundraiser in the family room: no Carpet in the bedroom: no Heating: gas Cooling: central Pet: yes 1 dog x 4 yrs  Family History: Family History  Problem Relation Age of Onset  . Stroke Father   . Allergic rhinitis Mother   . Angioedema Neg Hx   . Atopy Neg Hx   . Eczema Neg Hx   . Immunodeficiency Neg Hx   . Urticaria Neg Hx   . Asthma Neg Hx    Review of Systems  Constitutional: Negative for appetite change, chills, fever and unexpected weight change.  HENT: Positive for congestion, postnasal drip, rhinorrhea and sneezing.   Eyes: Negative for itching.  Respiratory: Negative for cough, chest tightness, shortness of breath and wheezing.   Cardiovascular: Negative for chest pain.  Gastrointestinal: Negative for abdominal pain.  Genitourinary: Negative for difficulty urinating.  Skin: Negative for rash.  Allergic/Immunologic: Negative for food allergies.  Neurological: Negative for headaches.   Objective: BP (!) 142/70 (BP Location: Left Arm, Patient Position: Sitting, Cuff Size: Normal)   Pulse 84   Temp 98 F (36.7 C) (Temporal)   Resp 16   Ht 5\' 8"  (1.727 m)   Wt 179 lb 12.8 oz (81.6 kg)   SpO2 95%   BMI 27.34 kg/m  Body mass index is 27.34 kg/m. Physical Exam  Constitutional: He is oriented to person, place, and time. He appears well-developed and well-nourished.  HENT:  Head: Normocephalic and atraumatic.  Right Ear: External ear normal.  Left Ear: External ear normal.  Nose: Nose normal.  Mouth/Throat: Oropharynx is clear and moist.  Eyes: Conjunctivae and EOM are normal.  Neck: Neck supple.  Cardiovascular: Normal rate and regular rhythm. Exam reveals no gallop and no friction rub.  Murmur  heard. Pulmonary/Chest: Effort normal and breath sounds normal. He has no wheezes. He has no rales.  Abdominal: Soft.  Neurological: He is alert and oriented to person, place, and time.  Skin: Skin is warm. No rash noted.  Psychiatric: He has a normal mood and affect. His behavior is normal.  Nursing note and vitals reviewed.  The plan was reviewed with the patient/family, and all questions/concerned were addressed.  It was my pleasure to see Agapito today and participate in his care. Please feel free to contact me with any questions or concerns.  Sincerely,  Rexene Alberts, DO Allergy & Immunology  [facility] office: [phone] [facility] office: [place] office: [phone]

## 2019-01-13 NOTE — Telephone Encounter (Signed)
Patient did state that he would need a script for the Flonase as well. Rx sent in.

## 2019-01-13 NOTE — Patient Instructions (Addendum)
Today's skin testing showed: Negative to environmental allergy panel but positive control was borderline. Get bloodwork to double check results. Will make additional recommendations based on results.    Start dymista 1 spray in each nostril twice a day. This replaces Flonase for now.  If your insurance does not cover this then let us know.   Nasal saline spray (i.e., Simply Saline) or nasal saline lavage (i.e., NeilMed) is recommended as needed and prior to medicated nasal sprays.  Continue the Claritin for now.   Stop Singulair.   Follow up in 2 months

## 2019-01-13 NOTE — Addendum Note (Signed)
Addended by: Herbie Drape on: 01/13/2019 03:21 PM   Modules accepted: Orders

## 2019-01-15 LAB — ALLERGENS W/TOTAL IGE AREA 2

## 2019-03-15 ENCOUNTER — Ambulatory Visit: Payer: Medicare Other | Admitting: Allergy

## 2019-03-15 ENCOUNTER — Encounter: Payer: Self-pay | Admitting: Allergy

## 2019-03-15 ENCOUNTER — Other Ambulatory Visit: Payer: Self-pay

## 2019-03-15 VITALS — BP 138/80 | HR 78 | Temp 98.0°F | Resp 17 | Ht 68.0 in

## 2019-03-15 DIAGNOSIS — J310 Chronic rhinitis: Secondary | ICD-10-CM | POA: Diagnosis not present

## 2019-03-15 MED ORDER — IPRATROPIUM BROMIDE 0.06 % NA SOLN: 2.0000 | Freq: Three times a day (TID) | NASAL | 5 refills | Status: DC | PRN

## 2019-03-15 MED ORDER — BUDESONIDE 0.5 MG/2ML IN SUSP: RESPIRATORY_TRACT | 5 refills | Status: DC

## 2019-03-15 NOTE — Assessment & Plan Note (Signed)
Past history - Perennial rhinitis symptoms for 60+ years but worse in the last year.  Symptoms improved since sinus surgery in March 2020.  Tried over-the-counter antihistamines, Flonase with some benefit.  Singulair was ineffective.  Skin testing over 40 years ago was positive to dust mites per patient report. 2020 skin testing showed: Negative to environmental allergy panel and 2020 immunocap was negative as well. Patient had issues with Zyrtec and urinary flow due to his history of prostate hypertrophy. Interim history - 30% improvement with Flonase and azelastine nasal spray.   Stop Flonase, Azelastine, Claritin.  Start Atrovent nasal spray 2 sprays per nostril three times a day as needed for drainage.  This replaces azelastine.   Start Budesonide nasal wash 2 times a day for nasal congestion. Instructions given.   This replaces Flonase.

## 2019-03-15 NOTE — Progress Notes (Signed)
Follow Up Note  RE: Jon Ewing MRN: [id number] DOB: 1946-04-23 Date of Office Visit: 03/15/2019  Referring provider: Alroy Dust, L.Marlou Sa, MD Primary care provider: Alroy Dust, Carlean Jews.Marlou Sa, MD  Chief Complaint: Allergic Rhinitis  (post nasal drip, throat problems ) and Nasal Congestion  History of Present Illness: I had the pleasure of seeing Jon Ewing for a follow up visit at the [facility] [place] [facility] [place] on 03/15/2019. He is a 73 y.o. male, who is being followed for non-allergic rhinitis. Today he is here for regular follow up visit. His previous allergy office visit was on 01/13/2019 with Dr. Maudie Mercury.   Patient used Flonase 1 spray BID and azelastine 1 spray BID with about 30% benefit in symptoms.  Taking Claritin daily but not sure it's doing anything.  Stopped Singulair with no worsening symptoms.   Still having some nasal congestion and drainage. No nosebleeds.  Assessment and Plan: Jon Ewing is a 73 y.o. male with: Chronic rhinitis Past history - Perennial rhinitis symptoms for 60+ years but worse in the last year.  Symptoms improved since sinus surgery in March 2020.  Tried over-the-counter antihistamines, Flonase with some benefit.  Singulair was ineffective.  Skin testing over 40 years ago was positive to dust mites per patient report. 2020 skin testing showed: Negative to environmental allergy panel and 2020 immunocap was negative as well. Patient had issues with Zyrtec and urinary flow due to his history of prostate hypertrophy. Interim history - 30% improvement with Flonase and azelastine nasal spray.   Stop Flonase, Azelastine, Claritin.  Start Atrovent nasal spray 2 sprays per nostril three times a day as needed for drainage.  This replaces azelastine.   Start Budesonide nasal wash 2 times a day for nasal congestion. Instructions given.   This replaces Flonase.   Return in about 2 months (around 05/15/2019).  Meds ordered this encounter  Medications  . ipratropium  (ATROVENT) 0.06 % nasal spray    Sig: Place 2 sprays into both nostrils 3 (three) times daily as needed for rhinitis (post nasal drip, drainage).    Dispense:  15 mL    Refill:  5  . budesonide (PULMICORT) 0.5 MG/2ML nebulizer solution    Sig: Mix 1 vial with the saline nasal wash and use to rinse sinuses out twice a day for nasal congestion.    Dispense:  120 mL    Refill:  5   Diagnostics: None.  Medication List:  Current Outpatient Medications  Medication Sig Dispense Refill  . finasteride (PROSCAR) 5 MG tablet Take 5 mg by mouth every evening.    Marland Kitchen Hypertonic Nasal Wash (SINUS RINSE NA) daily.    Marland Kitchen ibuprofen (ADVIL,MOTRIN) 200 MG tablet Take 400 mg by mouth every 6 (six) hours as needed for headache or moderate pain.    . Misc Natural Products (GLUCOSAMINE CHONDROITIN TRIPLE) TABS Take 1 tablet by mouth 2 (two) times daily.    Marland Kitchen omeprazole (PRILOSEC) 20 MG capsule Take 1 capsule (20 mg total) by mouth 2 (two) times daily before a meal. 60 capsule 5  . rOPINIRole (REQUIP) 2 MG tablet Take 2 mg by mouth at bedtime.    . simvastatin (ZOCOR) 40 MG tablet Take 40 mg by mouth every evening.     . sodium chloride (OCEAN) 0.65 % SOLN nasal spray Place 1 spray into both nostrils 4 (four) times daily.    . tamsulosin (FLOMAX) 0.4 MG CAPS capsule Take 0.8 mg by mouth every evening.     . budesonide (  PULMICORT) 0.5 MG/2ML nebulizer solution Mix 1 vial with the saline nasal wash and use to rinse sinuses out twice a day for nasal congestion. 120 mL 5  . ipratropium (ATROVENT) 0.06 % nasal spray Place 2 sprays into both nostrils 3 (three) times daily as needed for rhinitis (post nasal drip, drainage). 15 mL 5   No current facility-administered medications for this visit.    Allergies: No Known Allergies I reviewed his past medical history, social history, family history, and environmental history and no significant changes have been reported from his previous visit.  Review of Systems   Constitutional: Negative for appetite change, chills, fever and unexpected weight change.  HENT: Positive for congestion, postnasal drip, rhinorrhea and sneezing.   Eyes: Negative for itching.  Respiratory: Negative for cough, chest tightness, shortness of breath and wheezing.   Cardiovascular: Negative for chest pain.  Gastrointestinal: Negative for abdominal pain.  Genitourinary: Negative for difficulty urinating.  Skin: Negative for rash.  Allergic/Immunologic: Negative for environmental allergies and food allergies.  Neurological: Negative for headaches.   Objective: BP 138/80   Pulse 78   Temp 98 F (36.7 C) (Temporal)   Resp 17   Ht 5\' 8"  (1.727 m)   SpO2 95%   BMI 27.34 kg/m  Body mass index is 27.34 kg/m. Physical Exam  Constitutional: He is oriented to person, place, and time. He appears well-developed and well-nourished.  HENT:  Head: Normocephalic and atraumatic.  Right Ear: External ear normal.  Left Ear: External ear normal.  Nose: Nose normal.  Mouth/Throat: Oropharynx is clear and moist.  Eyes: Conjunctivae and EOM are normal.  Neck: Neck supple.  Cardiovascular: Normal rate and regular rhythm. Exam reveals no gallop and no friction rub.  Murmur heard. Pulmonary/Chest: Effort normal and breath sounds normal. He has no wheezes. He has no rales.  Abdominal: Soft.  Neurological: He is alert and oriented to person, place, and time.  Skin: Skin is warm. No rash noted.  Psychiatric: He has a normal mood and affect. His behavior is normal.  Nursing note and vitals reviewed.  Previous notes and tests were reviewed. The plan was reviewed with the patient/family, and all questions/concerned were addressed.  It was my pleasure to see Jon Ewing today and participate in his care. Please feel free to contact me with any questions or concerns.  Sincerely,  Rexene Alberts, DO Allergy & Immunology  [facility] office: [phone]  [facility] office: [place] office: [phone]

## 2019-03-15 NOTE — Patient Instructions (Addendum)
Stop Flonase and azelastine.  Stop Claritin as ineffective.    Start Atrovent nasal spray 2 sprays per nostril three times a day as needed for drainage.  This replaces azelastine.   Start Budesonide nasal wash 2 times a day for nasal congestion. See below for instructions.  This replaces Flonase.     Follow up in 2 months or sooner if needed.   Buffered Isotonic Saline Irrigations:  Goal: . When you irrigate with the isotonic saline (salt water) it washes mucous and other debris from your nose that could be contributing to your nasal symptoms.   Recipe: Marland Kitchen Obtain 1 quart jar that is clean . Fill with clean (bottled, boiled or distilled) water . Add 1-2 heaping teaspoons of salt without iodine o If the solution with 2 teaspoons of salt is too strong, adjust the amount down until better tolerated . Add 1 teaspoon of [employer] baking soda (pure bicarbonate) . Mix ingredients together and store at room temperature and discard after 1 week * Alternatively you can buy pre made salt packets for the [employer] bottle or there          are other over the counter brands available  Instructions: . Warm  cup of the solution in the microwave if desired but be careful not to overheat as this will burn the inside of your nose . Stand over a sink (or do it while you shower) and squirt the solution into one side of your nose aiming towards the back of your head o Sometimes saying "k k k k k" while irrigating can be helpful to prevent fluid from going down your throat  . The solution will travel to the back of your nose and then come out the other side . Perform this again on the other side . Try to do this twice a day . If you are using a nasal spray in addition to the irrigation, irrigate first and then use the topical nasal spray otherwise you will wash the nasal spray out of your nose.

## 2019-03-26 ENCOUNTER — Telehealth: Payer: Self-pay | Admitting: Allergy

## 2019-03-26 MED ORDER — AZELASTINE HCL 0.1 % NA SOLN: NASAL | 5 refills | Status: DC

## 2019-03-26 NOTE — Telephone Encounter (Signed)
Please call patient.  Have him stop ipratropium.  And start using the azelastine 1-2 sprays per nostril 1-2 times a day as needed for drainage.  Continue Budesonide nasal wash 2 times a day for nasal congestion.

## 2019-03-26 NOTE — Telephone Encounter (Signed)
Dr. Maudie Mercury, please advise.

## 2019-03-26 NOTE — Telephone Encounter (Signed)
Patient states he is having a side effect of his ipratropium. He said he is having difficulty urinating.

## 2019-03-26 NOTE — Telephone Encounter (Signed)
Informed patient of prescription change and sent azelastine in.

## 2019-03-26 NOTE — Addendum Note (Signed)
Addended by: Farrel Demark R on: 03/26/2019 03:34 PM   Modules accepted: Orders

## 2019-04-01 NOTE — Telephone Encounter (Signed)
The patient called today to report that he is having some wheezing and difficulty breathing with the use of the budesonide rinses. He has tried Flonase in the past with minimal improvement. Reviewed his chart and I asked him if he has started the azelastine. He just got back into town and has not picked it up.  I recommended that he start the azelastine two sprays per nostril BID. I asked him to do this through the weekend and give [place] an update on Monday. We could add on Nasacort at that time (which we have samples of), but given that it is in the same class at budesonide, I will hold off for now.  Patient in agreement with the plan.   Salvatore Marvel, MD [facility]

## 2019-04-14 ENCOUNTER — Encounter: Payer: Self-pay | Admitting: Emergency Medicine

## 2019-04-14 ENCOUNTER — Telehealth (INDEPENDENT_AMBULATORY_CARE_PROVIDER_SITE_OTHER): Payer: Medicare Other | Admitting: Emergency Medicine

## 2019-04-14 ENCOUNTER — Telehealth: Payer: Medicare Other | Admitting: Family

## 2019-04-14 DIAGNOSIS — J301 Allergic rhinitis due to pollen: Secondary | ICD-10-CM

## 2019-04-14 DIAGNOSIS — R053 Chronic cough: Secondary | ICD-10-CM

## 2019-04-14 DIAGNOSIS — R05 Cough: Secondary | ICD-10-CM | POA: Diagnosis not present

## 2019-04-14 NOTE — Progress Notes (Signed)
E visit for Allergic Rhinitis We are sorry that you are not feeling well.  Here is how we plan to help!  Based on what you have shared with me it looks like you have Allergic Rhinitis.  Rhinitis is when a reaction occurs that causes nasal congestion, runny nose, sneezing, and itching.  Most types of rhinitis are caused by an inflammation and are associated with symptoms in the eyes ears or throat. There are several types of rhinitis.  The most common are acute rhinitis, which is usually caused by a viral illness, allergic or seasonal rhinitis, and nonallergic or year-round rhinitis.  Nasal allergies occur certain times of the year.  Allergic rhinitis is caused when allergens in the air trigger the release of histamine in the body.  Histamine causes itching, swelling, and fluid to build up in the fragile linings of the nasal passages, sinuses and eyelids.  An itchy nose and clear discharge are common.  I recommend the following over the counter treatments: You should take a daily dose of antihistamine and Xyzal 5 mg take 1 tablet daily  I also would recommend a nasal spray: Nasonex 2 sprays into each nostril once daily    HOME CARE:   You can use an over-the-counter saline nasal spray as needed  Avoid areas where there is heavy dust, mites, or molds  Stay indoors on windy days during the pollen season  Keep windows closed in home, at least in bedroom; use air conditioner.  Use high-efficiency house air filter  Keep windows closed in car, turn AC on re-circulate  Avoid playing out with dog during pollen season  GET HELP RIGHT AWAY IF:   If your symptoms do not improve within 10 days  You become short of breath  You develop yellow or green discharge from your nose for over 3 days  You have coughing fits  MAKE SURE YOU:   Understand these instructions  Will watch your condition  Will get help right away if you are not doing well or get worse  Thank you for choosing an  e-visit. Your e-visit answers were reviewed by a board certified advanced clinical practitioner to complete your personal care plan. Depending upon the condition, your plan could have included both over the counter or prescription medications. Please review your pharmacy choice. Be sure that the pharmacy you have chosen is open so that you can pick up your prescription now.  If there is a problem you may message your provider in [place] to have the prescription routed to another pharmacy. Your safety is important to [place]. If you have drug allergies check your prescription carefully.  For the next 24 hours, you can use MyChart to ask questions about today's visit, request a non-urgent call back, or ask for a work or school excuse from your e-visit provider. You will get an email in the next two days asking about your experience. I hope that your e-visit has been valuable and will speed your recovery.     Approximately 5 minutes was spent documenting and reviewing patient's chart.

## 2019-04-14 NOTE — Progress Notes (Signed)
Virtual Visit via Video Note  I connected with Jon Ewing on 04/14/19 at 10:45 AM EST by a video enabled telemedicine application and verified that I am speaking with the correct person using two identifiers.  Location: Patient: Home Provider: Office   I discussed the limitations of evaluation and management by telemedicine and the availability of in person appointments. The patient expressed understanding and agreed to proceed.  History of Present Illness: Follow-up visit for 73 year old gentleman, never smoker, for chronic cough.  No obstruction on pulmonary function testing.  He had chronic sinus disease, underwent surgery March 2020 (Dr. Wilburn Cornelia).  His cough improved significantly but was still occurring at her last visit in July..  I added back Singulair to his loratadine, Flonase, nasal saline rinses.  He has been seen by Dr. Maudie Mercury in allergy, no specific allergens identified on repeat skin testing.  Has since been able to come off Singulair, stopped Flonase, Astelin, loratadine.  He was started on Atrovent nasal spray and budesonide nasal washes, had to stop these because they were poorly tolerated due to side effects..   Observations/Objective: Still with some chronic drainage.  Following with Dr. Maudie Mercury with allergy.  He wonders whether there may be some benefit to getting a tertiary referral to see if any other testing or therapy would be beneficial.  He has been able to maintain the gains that we made with regard to his cough following a sinus surgery.  I am happy about that.  He does not have any other notable triggers.  We have treated GERD empirically in the past without much change and I do not think there would be any benefit to doing so again.  Assessment and Plan: He will talk to Dr. Maudie Mercury, see if any adjustments in his allergy regimen, rhinitis regimen are appropriate.  He will also get her opinion about a tertiary referral to see if other allergy evaluation will be  helpful.  Follow Up Instructions: As needed   I discussed the assessment and treatment plan with the patient. The patient was provided an opportunity to ask questions and all were answered. The patient agreed with the plan and demonstrated an understanding of the instructions.   The patient was advised to call back or seek an in-person evaluation if the symptoms worsen or if the condition fails to improve as anticipated.  I provided 15 minutes of non-face-to-face time during this encounter.   Collene Gobble, MD

## 2019-05-04 ENCOUNTER — Other Ambulatory Visit: Payer: Self-pay | Admitting: Dermatology

## 2019-05-17 ENCOUNTER — Ambulatory Visit: Payer: Medicare Other | Admitting: Allergy

## 2019-05-17 ENCOUNTER — Other Ambulatory Visit: Payer: Self-pay

## 2019-05-17 ENCOUNTER — Encounter: Payer: Self-pay | Admitting: Allergy

## 2019-05-17 VITALS — BP 126/72 | HR 82 | Temp 97.6°F | Resp 16

## 2019-05-17 DIAGNOSIS — J310 Chronic rhinitis: Secondary | ICD-10-CM

## 2019-05-17 NOTE — Assessment & Plan Note (Signed)
Past history - Perennial rhinitis symptoms for 60+ years but worse in the last year.  Symptoms improved since sinus surgery in March 2020.  Tried over-the-counter antihistamines, Flonase with some benefit.  Singulair was ineffective.  Skin testing over 40 years ago was positive to dust mites per patient report. 2020 skin testing showed: Negative to environmental allergy panel and 2020 immunocap was negative as well. Patient had issues with Zyrtec and urinary flow due to his history of prostate hypertrophy. Interim history - did not tolerate ipratropium nasal spray or budesonide nasal wash. Slowly improving but worse in the AM.   Continue azelastine nasal spray 2 spray per nostrils twice a day.  Start Nasacort 2 sprays per nostril daily. Sample given.  Use saline wash twice a day before using any of the nasal sprays.  Try to sleep on a different mattress for 1 week before changing the medication regimen. Patient concerned about the fiberglass in the mattress irritating his symptoms.   If not improved in 1 month then recommend going back to ENT.

## 2019-05-17 NOTE — Patient Instructions (Addendum)
Chronic rhinitis 2020 skin testing showed: Negative to environmental allergy panel and 2020 immunocap was negative as well.   Continue azelastine nasal spray 2 spray per nostrils twice a day. Start Nasacort 2 sprays per nostril daily. Use saline wash twice a day before using any of the nasal sprays.   Try to sleep on a different mattress for 1 week before changing the medication regimen.  If not improved in 1 month then recommend going back to ENT.   Follow up in 6 months or sooner if needed.

## 2019-05-17 NOTE — Progress Notes (Signed)
Follow Up Note  RE: Jon Ewing MRN: [id number] DOB: [birth year] Date of Office Visit: 05/17/2019  Referring provider: Alroy Dust, L.Marlou Sa, MD Primary care provider: Alroy Dust, Carlean Jews.Marlou Sa, MD  Chief Complaint: Chronic Rhinitis (doing better with his symptoms just not great. still having runny nose during the day along with post nasal drainage. worse in the mornings, better in the afternoon. )  History of Present Illness: I had the pleasure of seeing Jon Ewing for a follow up visit at the [facility] on 05/17/2019. He is a 73 y.o. male, who is being followed for chronic rhinitis. His previous allergy office visit was on 03/15/2019 with Dr. Maudie Mercury. Today is a regular follow up visit.  Chronic rhinitis Still has PND which is worse in the mornings. Rates it 7 or 8 out of 10. In the evening it goes to 3 or 4 out of 10.  Sometimes the discharge is yellowish/greenish but denies fevers, chills or sinus pressure/congestion.  The ipratropium caused some difficulty urinating so he stopped it.  Currently on azelastine 2 sprays BID with some benefit.  Stopped budesonide due to wheezing but still does saline rinse at night.   Patient last saw ENT in June 2020. Denies any reflux/heartburn issues. No CPAP at night.   Assessment and Plan: Jon Ewing is a 73 y.o. male with: Chronic rhinitis Past history - Perennial rhinitis symptoms for 60+ years but worse in the last year.  Symptoms improved since sinus surgery in March 2020.  Tried over-the-counter antihistamines, Flonase with some benefit.  Singulair was ineffective.  Skin testing over 40 years ago was positive to dust mites per patient report. 2020 skin testing showed: Negative to environmental allergy panel and 2020 immunocap was negative as well. Patient had issues with Zyrtec and urinary flow due to his history of prostate hypertrophy. Interim history - did not tolerate ipratropium nasal spray or budesonide nasal wash. Slowly improving but  worse in the AM.   Continue azelastine nasal spray 2 spray per nostrils twice a day.  Start Nasacort 2 sprays per nostril daily. Sample given.  Use saline wash twice a day before using any of the nasal sprays.  Try to sleep on a different mattress for 1 week before changing the medication regimen. Patient concerned about the fiberglass in the mattress irritating his symptoms.   If not improved in 1 month then recommend going back to ENT.  Return in about 6 months (around 11/15/2019).  Diagnostics: None.  Medication List:  Current Outpatient Medications  Medication Sig Dispense Refill   azelastine (ASTELIN) 0.1 % nasal spray Use 1-2 sprays per nostril 1-2 times a day as needed for nasal drainage 30 mL 5   finasteride (PROSCAR) 5 MG tablet Take 5 mg by mouth every evening.     Hypertonic Nasal Wash (SINUS RINSE NA) daily.     ibuprofen (ADVIL,MOTRIN) 200 MG tablet Take 400 mg by mouth every 6 (six) hours as needed for headache or moderate pain.     Misc Natural Products (GLUCOSAMINE CHONDROITIN TRIPLE) TABS Take 1 tablet by mouth 2 (two) times daily.     rOPINIRole (REQUIP) 2 MG tablet Take 2 mg by mouth at bedtime.     simvastatin (ZOCOR) 40 MG tablet Take 40 mg by mouth every evening.      sodium chloride (OCEAN) 0.65 % SOLN nasal spray Place 1 spray into both nostrils 4 (four) times daily.     tamsulosin (FLOMAX) 0.4 MG CAPS capsule Take 0.8 mg  by mouth every evening.      No current facility-administered medications for this visit.   Allergies: No Known Allergies I reviewed his past medical history, social history, family history, and environmental history and no significant changes have been reported from his previous visit.  Review of Systems  Constitutional: Negative for appetite change, chills, fever and unexpected weight change.  HENT: Positive for postnasal drip and rhinorrhea. Negative for congestion and sneezing.   Eyes: Negative for itching.  Respiratory:  Negative for cough, chest tightness, shortness of breath and wheezing.   Cardiovascular: Negative for chest pain.  Gastrointestinal: Negative for abdominal pain.  Genitourinary: Negative for difficulty urinating.  Skin: Negative for rash.  Allergic/Immunologic: Negative for environmental allergies and food allergies.  Neurological: Negative for headaches.   Objective: BP 126/72 (BP Location: Left Arm, Patient Position: Sitting, Cuff Size: Normal)    Pulse 82    Temp 97.6 F (36.4 C) (Temporal)    Resp 16    SpO2 96%  There is no height or weight on file to calculate BMI. Physical Exam  Constitutional: He is oriented to person, place, and time. He appears well-developed and well-nourished.  HENT:  Head: Normocephalic and atraumatic.  Right Ear: External ear normal.  Left Ear: External ear normal.  Nose: Nose normal.  Mouth/Throat: Oropharynx is clear and moist.  No uvula.  Eyes: Conjunctivae and EOM are normal.  Cardiovascular: Normal rate and regular rhythm. Exam reveals no gallop and no friction rub.  Murmur heard. Pulmonary/Chest: Effort normal and breath sounds normal. He has no wheezes. He has no rales.  Abdominal: Soft.  Musculoskeletal:     Cervical back: Neck supple.  Neurological: He is alert and oriented to person, place, and time.  Skin: Skin is warm. No rash noted.  Psychiatric: He has a normal mood and affect. His behavior is normal.  Nursing note and vitals reviewed.  Previous notes and tests were reviewed. The plan was reviewed with the patient/family, and all questions/concerned were addressed.  It was my pleasure to see Jon Ewing today and participate in his care. Please feel free to contact me with any questions or concerns.  Sincerely,  Rexene Alberts, DO Allergy & Immunology  [facility] office: [phone] [facility] office: [place] office: [phone]

## 2019-06-07 ENCOUNTER — Telehealth: Payer: Self-pay | Admitting: Allergy

## 2019-06-07 MED ORDER — TRIAMCINOLONE ACETONIDE 55 MCG/ACT NA AERO: 2.0000 | INHALATION_SPRAY | Freq: Every day | NASAL | 5 refills | Status: DC

## 2019-06-07 NOTE — Telephone Encounter (Signed)
Prescription has been sent in  

## 2019-06-07 NOTE — Telephone Encounter (Signed)
Pt called and needs to have a rx for nasacort called into [facility] [address]. [phone]

## 2019-06-25 ENCOUNTER — Telehealth: Payer: Self-pay | Admitting: Allergy

## 2019-06-25 DIAGNOSIS — J310 Chronic rhinitis: Secondary | ICD-10-CM

## 2019-06-25 MED ORDER — AZELASTINE HCL 0.15 % NA SOLN: NASAL | 5 refills | Status: AC

## 2019-06-25 NOTE — Telephone Encounter (Signed)
Patient called stating that the nasal spray prescribed does not last for the recommended dosage prescribed.  Patient is needing to use more as the medication wears off so the dosage will not last for the duration prescribed.  He can be reached at [phone]  Please advise.

## 2019-06-25 NOTE — Telephone Encounter (Signed)
I'm going to send in a strong azelastine dose of 0.15%.  Instructions are still the same of 1-2 sprays per nostril 1-2 times a day.

## 2019-06-25 NOTE — Telephone Encounter (Signed)
Dr. Maudie Mercury please advise. The patient is referring to Azelastine nasal spray.

## 2019-06-27 ENCOUNTER — Ambulatory Visit: Payer: Medicare Other

## 2019-06-28 NOTE — Telephone Encounter (Signed)
Order has been placed for an ENT referral with Dr. Benjamine Mola.

## 2019-06-28 NOTE — Telephone Encounter (Signed)
Please put in referral for ENT Diagnosis: Non-allergic rhinitis  Thank you.

## 2019-06-28 NOTE — Addendum Note (Signed)
Addended by: Chip Boer R on: 06/28/2019 01:34 PM   Modules accepted: Orders

## 2019-06-28 NOTE — Telephone Encounter (Signed)
Patient called very upset that no one called him on Friday. I let the patient know that our phones were down at a period time. Patient states there are other ways to contact patients.   I apologized to the patient and let him know that someone would call him today.   Please Advise

## 2019-06-28 NOTE — Telephone Encounter (Signed)
Spoke to patient today and informed him of the medication that was sent into his pharmacy Friday. He also stated he would like to be referred back to ENT because his symptoms hasn't significantly improved yet.

## 2019-06-29 NOTE — Telephone Encounter (Signed)
Called patient and advised. Patient stated that he would like to see Dr. Wilburn Cornelia since he has seen him in the past. Spoke with [facility] the referral coordinator and advised that since he has established care with Dr. Wilburn Cornelia he can just call to make an appointment. Patient verbalized understanding.

## 2019-06-29 NOTE — Telephone Encounter (Signed)
Referral has been placed to Dr Velvet Bathe office. P: [phone] F; [phone]  Address:  [address], [place], [place] [address]  Thanks

## 2019-07-12 ENCOUNTER — Ambulatory Visit: Payer: Medicare Other

## 2019-08-04 DIAGNOSIS — E7800 Pure hypercholesterolemia, unspecified: Secondary | ICD-10-CM | POA: Diagnosis not present

## 2019-08-04 DIAGNOSIS — Z0000 Encounter for general adult medical examination without abnormal findings: Secondary | ICD-10-CM | POA: Diagnosis not present

## 2019-08-04 DIAGNOSIS — I358 Other nonrheumatic aortic valve disorders: Secondary | ICD-10-CM | POA: Diagnosis not present

## 2019-08-04 DIAGNOSIS — G2581 Restless legs syndrome: Secondary | ICD-10-CM | POA: Diagnosis not present

## 2019-08-04 DIAGNOSIS — K219 Gastro-esophageal reflux disease without esophagitis: Secondary | ICD-10-CM | POA: Diagnosis not present

## 2019-08-04 DIAGNOSIS — J309 Allergic rhinitis, unspecified: Secondary | ICD-10-CM | POA: Diagnosis not present

## 2019-09-14 DIAGNOSIS — H2513 Age-related nuclear cataract, bilateral: Secondary | ICD-10-CM | POA: Diagnosis not present

## 2019-09-21 ENCOUNTER — Encounter: Payer: Self-pay | Admitting: Dermatology

## 2019-09-21 ENCOUNTER — Other Ambulatory Visit: Payer: Self-pay

## 2019-09-21 ENCOUNTER — Ambulatory Visit: Payer: Medicare PPO | Admitting: Dermatology

## 2019-09-21 DIAGNOSIS — L570 Actinic keratosis: Secondary | ICD-10-CM | POA: Diagnosis not present

## 2019-09-21 DIAGNOSIS — D485 Neoplasm of uncertain behavior of skin: Secondary | ICD-10-CM | POA: Diagnosis not present

## 2019-09-21 DIAGNOSIS — L820 Inflamed seborrheic keratosis: Secondary | ICD-10-CM | POA: Diagnosis not present

## 2019-09-21 DIAGNOSIS — L720 Epidermal cyst: Secondary | ICD-10-CM | POA: Diagnosis not present

## 2019-09-21 DIAGNOSIS — Z85828 Personal history of other malignant neoplasm of skin: Secondary | ICD-10-CM

## 2019-09-21 NOTE — Patient Instructions (Signed)

## 2019-10-06 ENCOUNTER — Encounter: Payer: Self-pay | Admitting: Dermatology

## 2019-10-06 NOTE — Progress Notes (Signed)
   Follow-Up Visit   Subjective  Jon Ewing is a 74 y.o. male who presents for the following: Annual Exam (left cheek new for weeks dont burn or bleed).  New growths Location: Left cheek and neck Duration: severail months Quality:  Associated Signs/Symptoms: Modifying Factors:  Severity:  Timing: Context: History of multiple nonmelanoma skin cancers  The following portions of the chart were reviewed this encounter and updated as appropriate: Tobacco  Allergies  Meds  Problems  Med Hx  Surg Hx  Fam Hx      Objective  Well appearing patient in no apparent distress; mood and affect are within normal limits.  All skin waist up examined.   Assessment & Plan  AK (actinic keratosis) (2) Right Submandibular Area; Left Forehead  Destruction of lesion - Left Forehead, Right Submandibular Area  Destruction method: cryotherapy   Informed consent: discussed and consent obtained   Timeout:  patient name, date of birth, surgical site, and procedure verified Lesion destroyed using liquid nitrogen: Yes   Region frozen until ice ball extended beyond lesion: Yes   Cryotherapy cycles:  5 Outcome: patient tolerated procedure well with no complications    Neoplasm of uncertain behavior of skin (2) Left Cheek  Skin / nail biopsy Type of biopsy: tangential   Informed consent: discussed and consent obtained   Timeout: patient name, date of birth, surgical site, and procedure verified   Procedure prep:  Patient was prepped and draped in usual sterile fashion Prep type:  Chlorhexidine Anesthesia: the lesion was anesthetized in a standard fashion   Anesthetic:  1% lidocaine w/ epinephrine 1-100,000 local infiltration Instrument used: flexible razor blade   Hemostasis achieved with: ferric subsulfate   Outcome: patient tolerated procedure well   Post-procedure details: wound care instructions given    Specimen 1 - Surgical pathology Differential Diagnosis: r/o ak Check  Margins: No  Left Anterior Neck  Skin / nail biopsy Type of biopsy: tangential   Informed consent: discussed and consent obtained   Timeout: patient name, date of birth, surgical site, and procedure verified   Procedure prep:  Patient was prepped and draped in usual sterile fashion Prep type:  Chlorhexidine Anesthesia: the lesion was anesthetized in a standard fashion   Anesthetic:  1% lidocaine w/ epinephrine 1-100,000 local infiltration Instrument used: flexible razor blade   Hemostasis achieved with: ferric subsulfate   Outcome: patient tolerated procedure well   Post-procedure details: wound care instructions given    Specimen 2 - Surgical pathology Differential Diagnosis:r/o bcc scc  Check Margins: No

## 2019-10-11 DIAGNOSIS — H9313 Tinnitus, bilateral: Secondary | ICD-10-CM | POA: Diagnosis not present

## 2019-10-11 DIAGNOSIS — R05 Cough: Secondary | ICD-10-CM | POA: Diagnosis not present

## 2019-10-11 DIAGNOSIS — K219 Gastro-esophageal reflux disease without esophagitis: Secondary | ICD-10-CM | POA: Diagnosis not present

## 2019-10-11 DIAGNOSIS — J310 Chronic rhinitis: Secondary | ICD-10-CM | POA: Diagnosis not present

## 2019-10-11 DIAGNOSIS — J329 Chronic sinusitis, unspecified: Secondary | ICD-10-CM | POA: Diagnosis not present

## 2019-10-29 DIAGNOSIS — H9313 Tinnitus, bilateral: Secondary | ICD-10-CM | POA: Diagnosis not present

## 2019-10-29 DIAGNOSIS — H9193 Unspecified hearing loss, bilateral: Secondary | ICD-10-CM | POA: Diagnosis not present

## 2019-11-02 DIAGNOSIS — S83241A Other tear of medial meniscus, current injury, right knee, initial encounter: Secondary | ICD-10-CM | POA: Diagnosis not present

## 2019-11-15 ENCOUNTER — Ambulatory Visit: Payer: Medicare Other | Admitting: Allergy

## 2019-12-06 DIAGNOSIS — S83241D Other tear of medial meniscus, current injury, right knee, subsequent encounter: Secondary | ICD-10-CM | POA: Diagnosis not present

## 2019-12-06 DIAGNOSIS — S83242D Other tear of medial meniscus, current injury, left knee, subsequent encounter: Secondary | ICD-10-CM | POA: Diagnosis not present

## 2019-12-25 DIAGNOSIS — C61 Malignant neoplasm of prostate: Secondary | ICD-10-CM | POA: Diagnosis not present

## 2019-12-25 DIAGNOSIS — R3912 Poor urinary stream: Secondary | ICD-10-CM | POA: Diagnosis not present

## 2019-12-25 DIAGNOSIS — R3915 Urgency of urination: Secondary | ICD-10-CM | POA: Diagnosis not present

## 2019-12-29 IMAGING — DX DG CHEST 2V
2 series · 2 of 2 positions shown · non-contrast
Comparison: 04/07/2018

CLINICAL DATA: Productive cough for 12 weeks, intermittent
shortness of breath

EXAM:
CHEST - 2 VIEW

[chest pa]
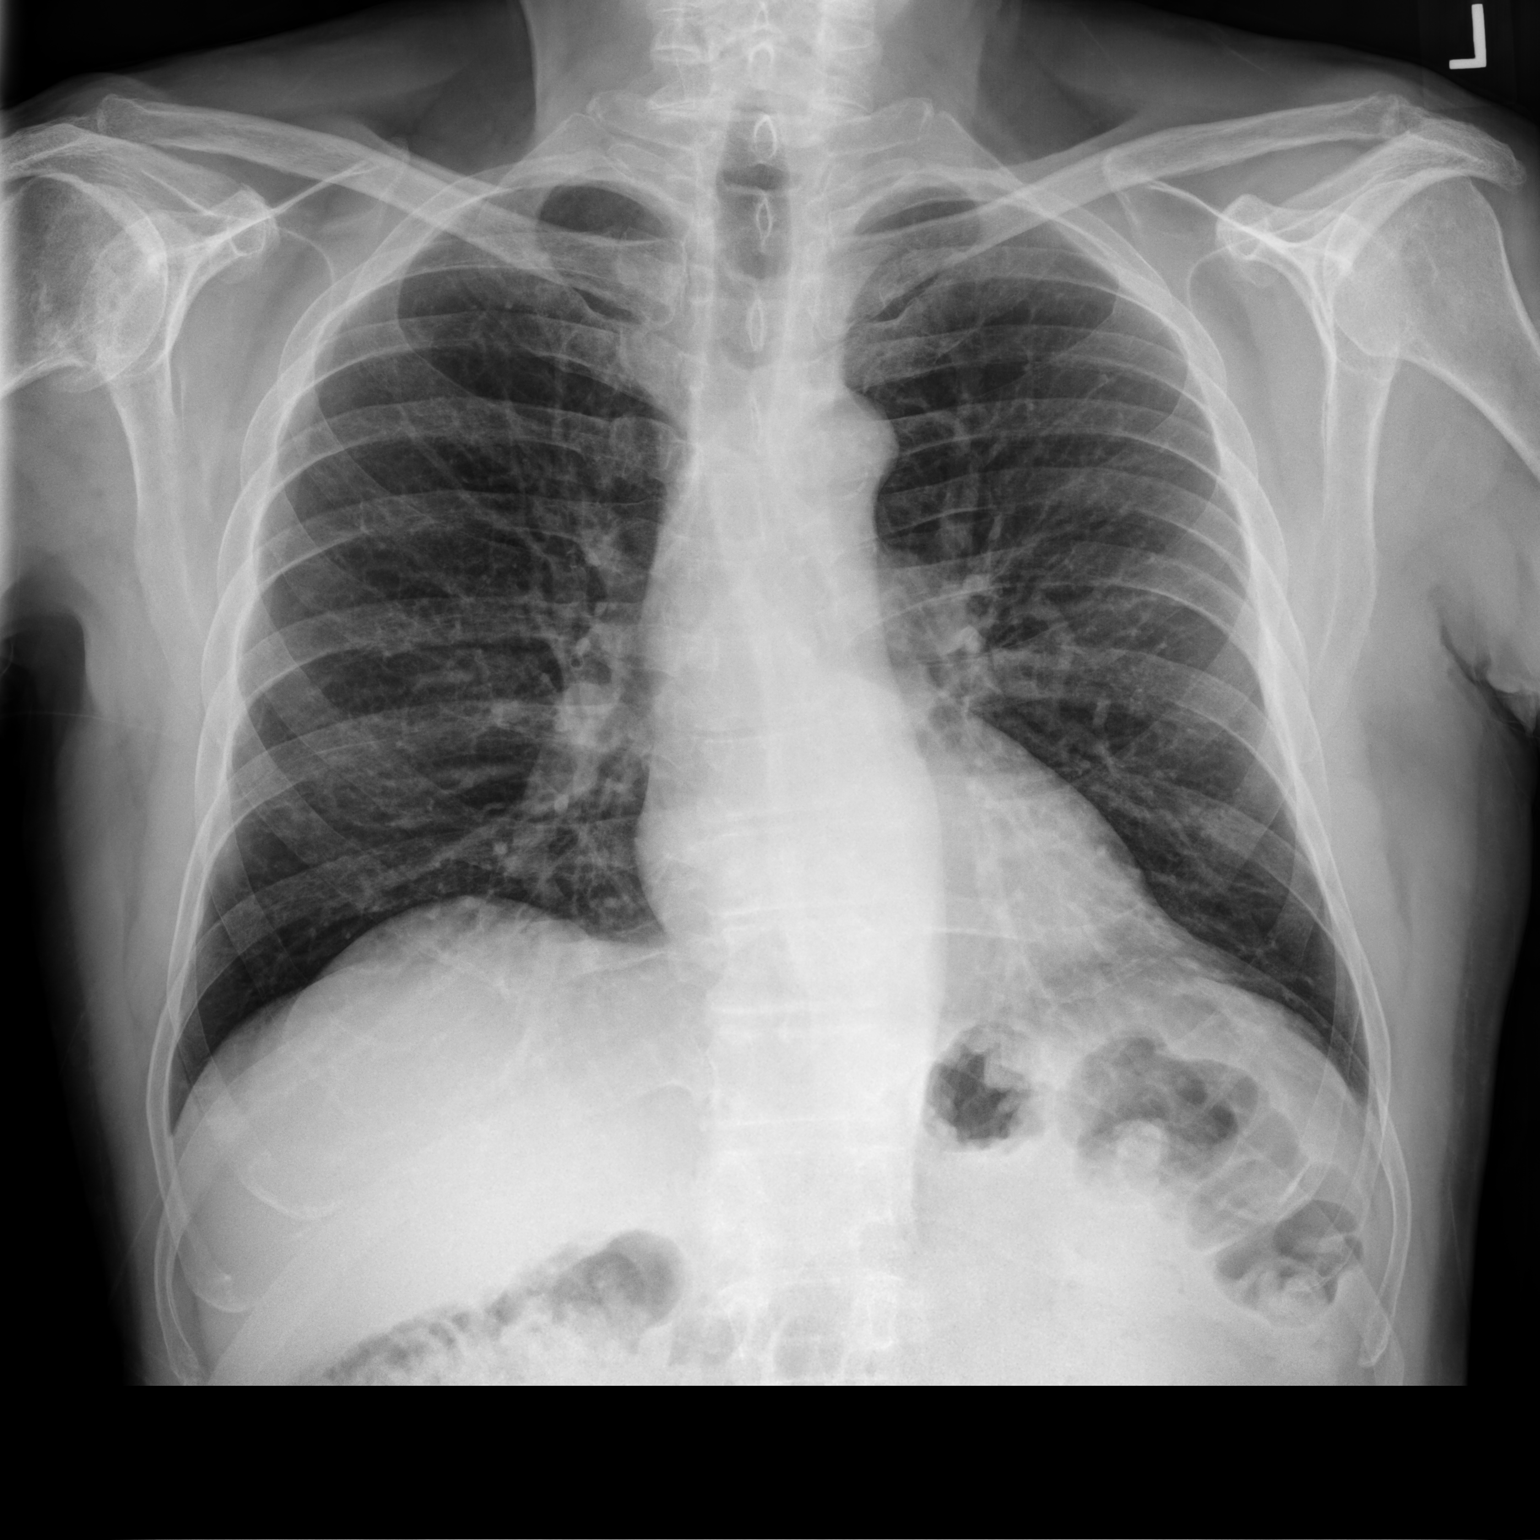

[chest lat]
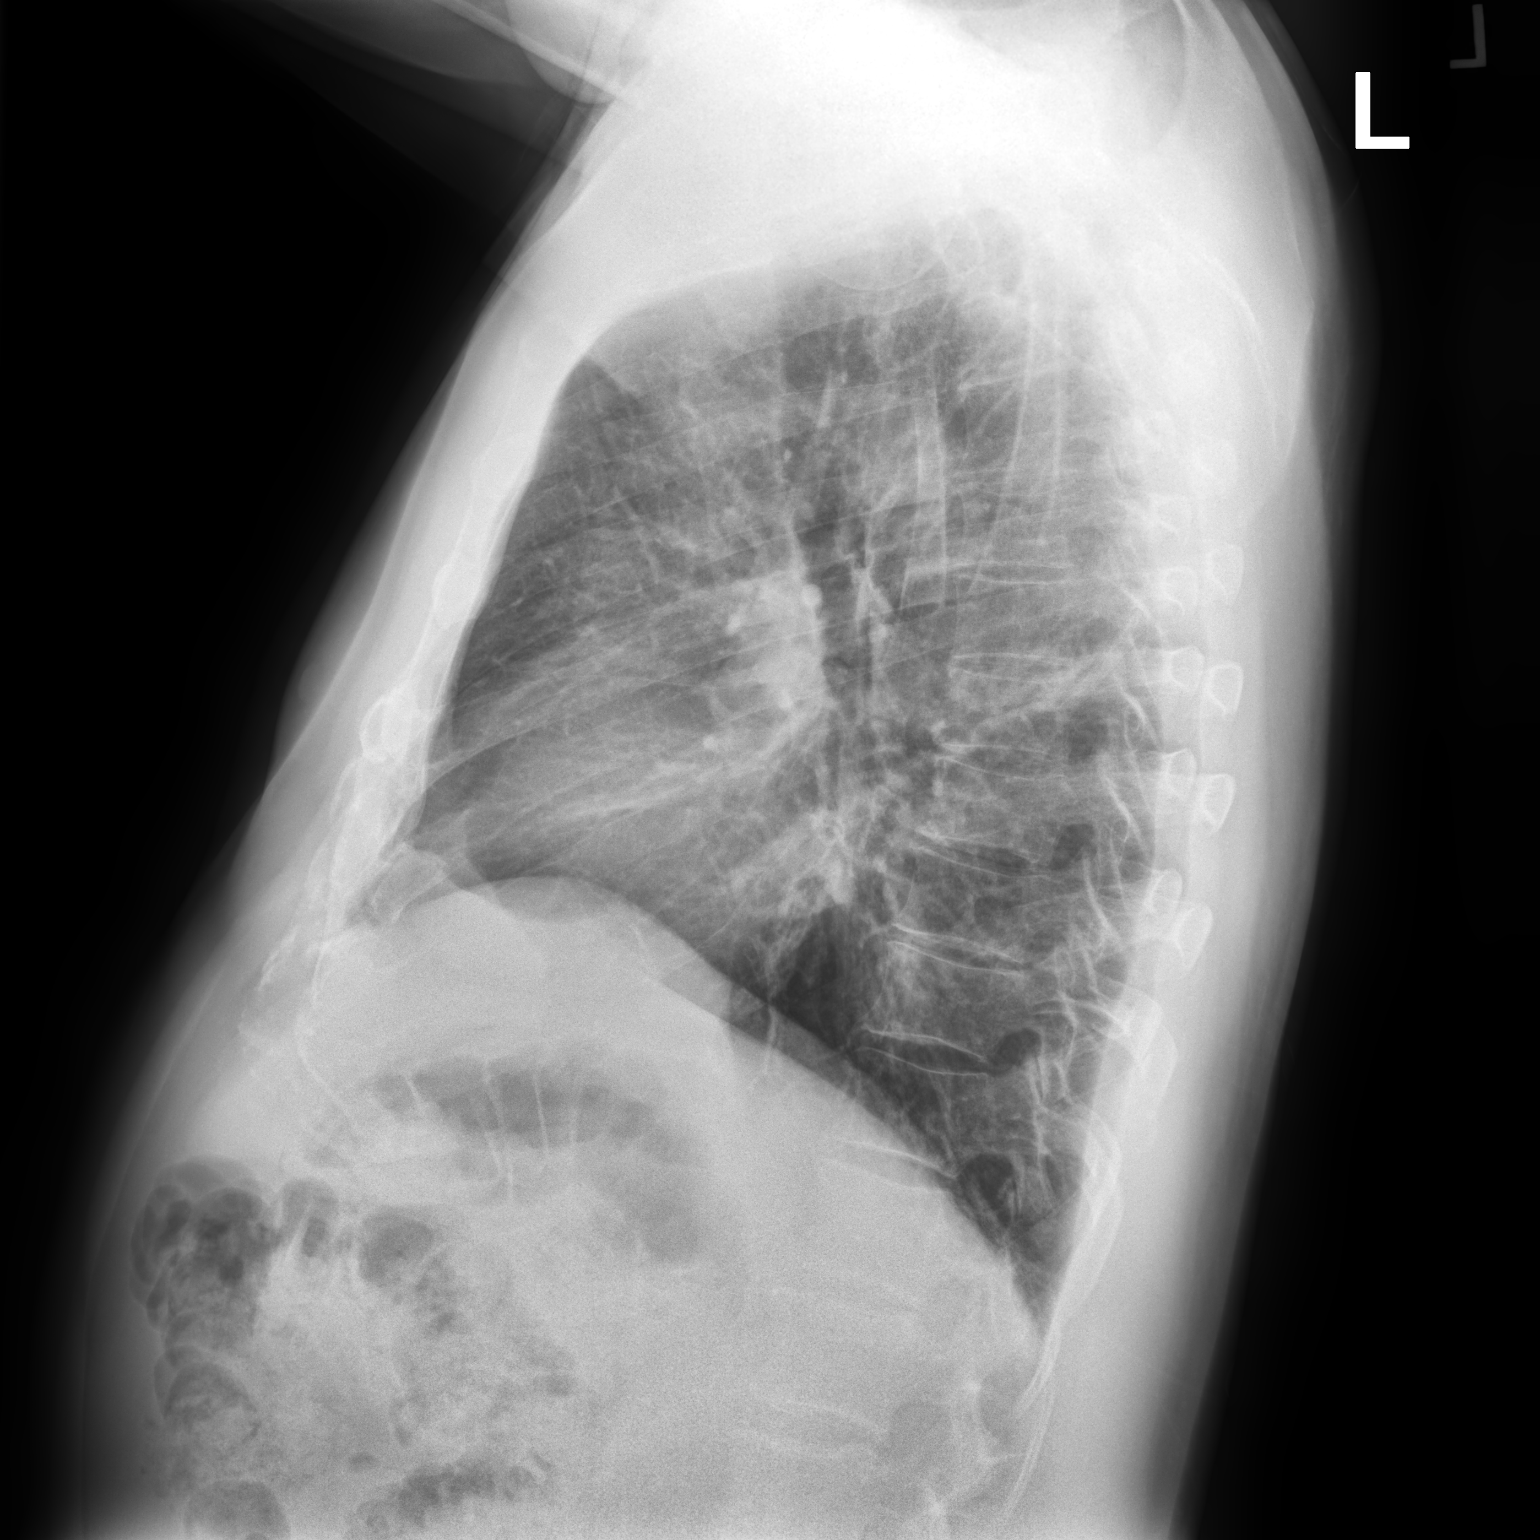

[2 of 2 positions shown; findings below may reference images not displayed]

FINDINGS: Normal heart size, mediastinal contours, and pulmonary vascularity.

Atherosclerotic calcification aorta.

Lungs clear.

No pleural effusion or pneumothorax.

RIGHT glenohumeral degenerative changes.
IMPRESSION: No acute abnormalities.

## 2020-01-05 DIAGNOSIS — M62838 Other muscle spasm: Secondary | ICD-10-CM | POA: Diagnosis not present

## 2020-01-05 DIAGNOSIS — R3915 Urgency of urination: Secondary | ICD-10-CM | POA: Diagnosis not present

## 2020-01-05 DIAGNOSIS — M6289 Other specified disorders of muscle: Secondary | ICD-10-CM | POA: Diagnosis not present

## 2020-01-05 DIAGNOSIS — M6281 Muscle weakness (generalized): Secondary | ICD-10-CM | POA: Diagnosis not present

## 2020-01-05 DIAGNOSIS — R102 Pelvic and perineal pain: Secondary | ICD-10-CM | POA: Diagnosis not present

## 2020-03-21 DIAGNOSIS — S83241D Other tear of medial meniscus, current injury, right knee, subsequent encounter: Secondary | ICD-10-CM | POA: Diagnosis not present

## 2020-03-28 ENCOUNTER — Ambulatory Visit: Payer: Medicare PPO | Admitting: Dermatology

## 2020-03-28 ENCOUNTER — Encounter: Payer: Self-pay | Admitting: Dermatology

## 2020-03-28 ENCOUNTER — Other Ambulatory Visit: Payer: Self-pay

## 2020-03-28 DIAGNOSIS — L821 Other seborrheic keratosis: Secondary | ICD-10-CM | POA: Diagnosis not present

## 2020-03-28 DIAGNOSIS — L570 Actinic keratosis: Secondary | ICD-10-CM

## 2020-03-28 DIAGNOSIS — Z1283 Encounter for screening for malignant neoplasm of skin: Secondary | ICD-10-CM

## 2020-03-28 DIAGNOSIS — D045 Carcinoma in situ of skin of trunk: Secondary | ICD-10-CM

## 2020-03-28 DIAGNOSIS — D485 Neoplasm of uncertain behavior of skin: Secondary | ICD-10-CM

## 2020-03-28 NOTE — Patient Instructions (Signed)

## 2020-03-29 DIAGNOSIS — S76311D Strain of muscle, fascia and tendon of the posterior muscle group at thigh level, right thigh, subsequent encounter: Secondary | ICD-10-CM | POA: Diagnosis not present

## 2020-03-29 DIAGNOSIS — M7631 Iliotibial band syndrome, right leg: Secondary | ICD-10-CM | POA: Diagnosis not present

## 2020-03-31 ENCOUNTER — Telehealth: Payer: Self-pay | Admitting: *Deleted

## 2020-03-31 NOTE — Telephone Encounter (Signed)
-----   Message from [name], MD sent at 03/31/2020  7:11 AM EST ----- Schedule surgery with Dr. Darene Lamer

## 2020-03-31 NOTE — Telephone Encounter (Signed)
Path to patient made surgery appointment with Dr.Tafeen.

## 2020-04-10 ENCOUNTER — Encounter: Payer: Self-pay | Admitting: Dermatology

## 2020-04-10 DIAGNOSIS — M7631 Iliotibial band syndrome, right leg: Secondary | ICD-10-CM | POA: Diagnosis not present

## 2020-04-10 DIAGNOSIS — S76311D Strain of muscle, fascia and tendon of the posterior muscle group at thigh level, right thigh, subsequent encounter: Secondary | ICD-10-CM | POA: Diagnosis not present

## 2020-04-10 DIAGNOSIS — Z23 Encounter for immunization: Secondary | ICD-10-CM | POA: Diagnosis not present

## 2020-04-10 NOTE — Progress Notes (Signed)
   Follow-Up Visit   Subjective  Jon Ewing is a 74 y.o. male who presents for the following: Skin Problem (wants places on back and face check per wife).  General skin check, check multiple spots on back Location:  Duration:  Quality:  Associated Signs/Symptoms: Modifying Factors:  Severity:  Timing: Context:   Objective  Well appearing patient in no apparent distress; mood and affect are within normal limits.  A full examination was performed including scalp, head, eyes, ears, nose, lips, neck, chest, axillae, abdomen, back, buttocks, bilateral upper extremities, bilateral lower extremities, hands, feet, fingers, toes, fingernails, and toenails. All findings within normal limits unless otherwise noted below.   Assessment & Plan    Screening exam for skin cancer Chest - Medial [facility])  Annual skin examination, self examine skin twice yearly  Neoplasm of uncertain behavior of skin Right Lower Back  Skin / nail biopsy Type of biopsy: tangential   Informed consent: discussed and consent obtained   Timeout: patient name, date of birth, surgical site, and procedure verified   Procedure prep:  Patient was prepped and draped in usual sterile fashion (Non sterile) Prep type:  Chlorhexidine Anesthesia: the lesion was anesthetized in a standard fashion   Anesthetic:  1% lidocaine w/ epinephrine 1-100,000 local infiltration Instrument used: flexible razor blade   Outcome: patient tolerated procedure well   Post-procedure details: wound care instructions given    Specimen 1 - Surgical pathology Differential Diagnosis: bcc scc isk Check Margins: No  AK (actinic keratosis) (5) Head - Anterior (Face)  Destruction of lesion - Head - Anterior (Face) Complexity: simple   Destruction method: cryotherapy   Informed consent: discussed and consent obtained   Timeout:  patient name, date of birth, surgical site, and procedure verified Lesion destroyed using liquid nitrogen:  Yes   Cryotherapy cycles:  5 Outcome: patient tolerated procedure well with no complications   Post-procedure details: wound care instructions given    Seborrheic keratosis Mid Back  Leave if stable     I, [name], MD, have reviewed all documentation for this visit.  The documentation on 04/10/20 for the exam, diagnosis, procedures, and orders are all accurate and complete.

## 2020-04-18 DIAGNOSIS — Z719 Counseling, unspecified: Secondary | ICD-10-CM | POA: Diagnosis not present

## 2020-04-20 DIAGNOSIS — M7631 Iliotibial band syndrome, right leg: Secondary | ICD-10-CM | POA: Diagnosis not present

## 2020-04-24 DIAGNOSIS — M25561 Pain in right knee: Secondary | ICD-10-CM | POA: Diagnosis not present

## 2020-05-11 DIAGNOSIS — Z719 Counseling, unspecified: Secondary | ICD-10-CM | POA: Diagnosis not present

## 2020-05-31 ENCOUNTER — Other Ambulatory Visit: Payer: Medicare PPO

## 2020-05-31 DIAGNOSIS — Z1152 Encounter for screening for COVID-19: Secondary | ICD-10-CM | POA: Diagnosis not present

## 2020-06-01 ENCOUNTER — Encounter: Payer: Medicare PPO | Admitting: Dermatology

## 2020-06-15 DIAGNOSIS — F320 Major depressive disorder, single episode, mild: Secondary | ICD-10-CM | POA: Diagnosis not present

## 2020-06-22 ENCOUNTER — Other Ambulatory Visit: Payer: Self-pay | Admitting: Urology

## 2020-06-22 DIAGNOSIS — C61 Malignant neoplasm of prostate: Secondary | ICD-10-CM

## 2020-07-07 DIAGNOSIS — C61 Malignant neoplasm of prostate: Secondary | ICD-10-CM | POA: Diagnosis not present

## 2020-07-14 ENCOUNTER — Ambulatory Visit
Admission: RE | Admit: 2020-07-14 | Discharge: 2020-07-14 | Disposition: A | Discharge status: Home / Self Care | Payer: Medicare PPO | Source: Ambulatory Visit | Attending: Urology | Admitting: Urology

## 2020-07-14 DIAGNOSIS — C61 Malignant neoplasm of prostate: Secondary | ICD-10-CM | POA: Diagnosis not present

## 2020-07-14 DIAGNOSIS — N402 Nodular prostate without lower urinary tract symptoms: Secondary | ICD-10-CM | POA: Diagnosis not present

## 2020-07-14 DIAGNOSIS — N3289 Other specified disorders of bladder: Secondary | ICD-10-CM | POA: Diagnosis not present

## 2020-07-14 DIAGNOSIS — N400 Enlarged prostate without lower urinary tract symptoms: Secondary | ICD-10-CM | POA: Diagnosis not present

## 2020-07-14 MED ORDER — GADOBENATE DIMEGLUMINE 529 MG/ML IV SOLN
15.0000 mL | Freq: Once | INTRAVENOUS | Status: AC | PRN
  Administered 2020-07-14: 15 mL via INTRAVENOUS

## 2020-07-24 DIAGNOSIS — C61 Malignant neoplasm of prostate: Secondary | ICD-10-CM | POA: Diagnosis not present

## 2020-07-24 DIAGNOSIS — R3915 Urgency of urination: Secondary | ICD-10-CM | POA: Diagnosis not present

## 2020-07-24 DIAGNOSIS — N401 Enlarged prostate with lower urinary tract symptoms: Secondary | ICD-10-CM | POA: Diagnosis not present

## 2020-08-03 ENCOUNTER — Encounter: Payer: Medicare PPO | Admitting: Dermatology

## 2020-08-10 DIAGNOSIS — E7800 Pure hypercholesterolemia, unspecified: Secondary | ICD-10-CM | POA: Diagnosis not present

## 2020-08-10 DIAGNOSIS — Z0000 Encounter for general adult medical examination without abnormal findings: Secondary | ICD-10-CM | POA: Diagnosis not present

## 2020-08-10 DIAGNOSIS — J309 Allergic rhinitis, unspecified: Secondary | ICD-10-CM | POA: Diagnosis not present

## 2020-08-10 DIAGNOSIS — I358 Other nonrheumatic aortic valve disorders: Secondary | ICD-10-CM | POA: Diagnosis not present

## 2020-08-10 DIAGNOSIS — K219 Gastro-esophageal reflux disease without esophagitis: Secondary | ICD-10-CM | POA: Diagnosis not present

## 2020-08-10 DIAGNOSIS — G2581 Restless legs syndrome: Secondary | ICD-10-CM | POA: Diagnosis not present

## 2020-08-31 ENCOUNTER — Other Ambulatory Visit: Payer: Self-pay

## 2020-08-31 ENCOUNTER — Encounter: Payer: Self-pay | Admitting: Dermatology

## 2020-08-31 ENCOUNTER — Ambulatory Visit (INDEPENDENT_AMBULATORY_CARE_PROVIDER_SITE_OTHER): Payer: Medicare PPO | Admitting: Dermatology

## 2020-08-31 DIAGNOSIS — D045 Carcinoma in situ of skin of trunk: Secondary | ICD-10-CM

## 2020-08-31 DIAGNOSIS — D099 Carcinoma in situ, unspecified: Secondary | ICD-10-CM

## 2020-08-31 NOTE — Patient Instructions (Signed)

## 2020-09-10 ENCOUNTER — Encounter: Payer: Self-pay | Admitting: Dermatology

## 2020-09-10 NOTE — Progress Notes (Signed)
   Follow-Up Visit   Subjective  Jon Ewing is a 75 y.o. male who presents for the following: Procedure (Here for treatment- right lower back- cis x 1).  CIS back Location:  Duration:  Quality:  Associated Signs/Symptoms: Modifying Factors:  Severity:  Timing: Context: For treatment  Objective  Well appearing patient in no apparent distress; mood and affect are within normal limits. Objective  Right Lower Back: Lesion identified by Dr.Dmiyah Liscano and nurse in room.      All skin waist up examined.   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  Destruction of lesion Complexity: simple   Destruction method: electrodesiccation and curettage   Informed consent: discussed and consent obtained   Timeout:  patient name, date of birth, surgical site, and procedure verified Anesthesia: the lesion was anesthetized in a standard fashion   Anesthetic:  1% lidocaine w/ epinephrine 1-100,000 local infiltration Curettage performed in three different directions: Yes   Electrodesiccation performed over the curetted area: Yes   Curettage cycles:  3 Lesion length (cm):  2 Lesion width (cm):  2 Margin per side (cm):  0 Final wound size (cm):  2 Hemostasis achieved with:  ferric subsulfate Outcome: patient tolerated procedure well with no complications   Additional details:  Wound innoculated with 5 fluorouracil solution.      I, [name], MD, have reviewed all documentation for this visit.  The documentation on 09/10/20 for the exam, diagnosis, procedures, and orders are all accurate and complete.

## 2020-09-25 ENCOUNTER — Other Ambulatory Visit (HOSPITAL_BASED_OUTPATIENT_CLINIC_OR_DEPARTMENT_OTHER): Payer: Self-pay

## 2020-09-25 MED ORDER — PAXLOVID 20 X 150 MG & 10 X 100MG PO TBPK
ORAL_TABLET | ORAL | 0 refills | Status: DC
  Filled 2020-09-25: qty 30, 5d supply, fill #0

## 2020-10-19 ENCOUNTER — Other Ambulatory Visit: Payer: Self-pay | Admitting: Allergy

## 2020-11-06 DIAGNOSIS — J310 Chronic rhinitis: Secondary | ICD-10-CM | POA: Diagnosis not present

## 2020-11-07 DIAGNOSIS — F4320 Adjustment disorder, unspecified: Secondary | ICD-10-CM | POA: Diagnosis not present

## 2020-11-16 DIAGNOSIS — H524 Presbyopia: Secondary | ICD-10-CM | POA: Diagnosis not present

## 2020-11-16 DIAGNOSIS — H5203 Hypermetropia, bilateral: Secondary | ICD-10-CM | POA: Diagnosis not present

## 2020-11-16 DIAGNOSIS — H2513 Age-related nuclear cataract, bilateral: Secondary | ICD-10-CM | POA: Diagnosis not present

## 2020-11-16 DIAGNOSIS — H52223 Regular astigmatism, bilateral: Secondary | ICD-10-CM | POA: Diagnosis not present

## 2020-11-16 DIAGNOSIS — H53002 Unspecified amblyopia, left eye: Secondary | ICD-10-CM | POA: Diagnosis not present

## 2020-12-05 DIAGNOSIS — F4320 Adjustment disorder, unspecified: Secondary | ICD-10-CM | POA: Diagnosis not present

## 2020-12-14 DIAGNOSIS — H903 Sensorineural hearing loss, bilateral: Secondary | ICD-10-CM | POA: Diagnosis not present

## 2020-12-18 DIAGNOSIS — M7581 Other shoulder lesions, right shoulder: Secondary | ICD-10-CM | POA: Diagnosis not present

## 2020-12-18 DIAGNOSIS — S83281A Other tear of lateral meniscus, current injury, right knee, initial encounter: Secondary | ICD-10-CM | POA: Diagnosis not present

## 2020-12-18 DIAGNOSIS — S83241A Other tear of medial meniscus, current injury, right knee, initial encounter: Secondary | ICD-10-CM | POA: Diagnosis not present

## 2020-12-22 DIAGNOSIS — M25561 Pain in right knee: Secondary | ICD-10-CM | POA: Diagnosis not present

## 2021-01-18 DIAGNOSIS — S83241D Other tear of medial meniscus, current injury, right knee, subsequent encounter: Secondary | ICD-10-CM | POA: Diagnosis not present

## 2021-01-24 DIAGNOSIS — C61 Malignant neoplasm of prostate: Secondary | ICD-10-CM | POA: Diagnosis not present

## 2021-02-05 DIAGNOSIS — R3915 Urgency of urination: Secondary | ICD-10-CM | POA: Diagnosis not present

## 2021-02-05 DIAGNOSIS — C61 Malignant neoplasm of prostate: Secondary | ICD-10-CM | POA: Diagnosis not present

## 2021-03-21 ENCOUNTER — Other Ambulatory Visit: Payer: Self-pay

## 2021-03-21 ENCOUNTER — Encounter: Payer: Self-pay | Admitting: Dermatology

## 2021-03-21 ENCOUNTER — Ambulatory Visit: Payer: Medicare PPO | Admitting: Dermatology

## 2021-03-21 DIAGNOSIS — C44519 Basal cell carcinoma of skin of other part of trunk: Secondary | ICD-10-CM | POA: Diagnosis not present

## 2021-03-21 DIAGNOSIS — M25561 Pain in right knee: Secondary | ICD-10-CM | POA: Diagnosis not present

## 2021-03-21 DIAGNOSIS — D692 Other nonthrombocytopenic purpura: Secondary | ICD-10-CM

## 2021-03-21 DIAGNOSIS — D485 Neoplasm of uncertain behavior of skin: Secondary | ICD-10-CM

## 2021-03-21 DIAGNOSIS — L720 Epidermal cyst: Secondary | ICD-10-CM | POA: Diagnosis not present

## 2021-03-21 DIAGNOSIS — L570 Actinic keratosis: Secondary | ICD-10-CM

## 2021-03-21 DIAGNOSIS — D171 Benign lipomatous neoplasm of skin and subcutaneous tissue of trunk: Secondary | ICD-10-CM

## 2021-03-21 DIAGNOSIS — Z01818 Encounter for other preprocedural examination: Secondary | ICD-10-CM | POA: Diagnosis not present

## 2021-03-21 NOTE — Patient Instructions (Signed)

## 2021-03-26 DIAGNOSIS — M7631 Iliotibial band syndrome, right leg: Secondary | ICD-10-CM | POA: Diagnosis not present

## 2021-03-26 DIAGNOSIS — M94261 Chondromalacia, right knee: Secondary | ICD-10-CM | POA: Diagnosis not present

## 2021-03-26 DIAGNOSIS — G8918 Other acute postprocedural pain: Secondary | ICD-10-CM | POA: Diagnosis not present

## 2021-03-26 DIAGNOSIS — S83281A Other tear of lateral meniscus, current injury, right knee, initial encounter: Secondary | ICD-10-CM | POA: Diagnosis not present

## 2021-03-26 DIAGNOSIS — M1711 Unilateral primary osteoarthritis, right knee: Secondary | ICD-10-CM | POA: Diagnosis not present

## 2021-03-26 DIAGNOSIS — S83241A Other tear of medial meniscus, current injury, right knee, initial encounter: Secondary | ICD-10-CM | POA: Diagnosis not present

## 2021-04-09 DIAGNOSIS — M25661 Stiffness of right knee, not elsewhere classified: Secondary | ICD-10-CM | POA: Diagnosis not present

## 2021-04-09 DIAGNOSIS — M25561 Pain in right knee: Secondary | ICD-10-CM | POA: Diagnosis not present

## 2021-04-09 DIAGNOSIS — M6281 Muscle weakness (generalized): Secondary | ICD-10-CM | POA: Diagnosis not present

## 2021-04-09 DIAGNOSIS — S83231D Complex tear of medial meniscus, current injury, right knee, subsequent encounter: Secondary | ICD-10-CM | POA: Diagnosis not present

## 2021-04-09 DIAGNOSIS — S83271D Complex tear of lateral meniscus, current injury, right knee, subsequent encounter: Secondary | ICD-10-CM | POA: Diagnosis not present

## 2021-04-11 ENCOUNTER — Encounter: Payer: Self-pay | Admitting: Dermatology

## 2021-04-11 NOTE — Progress Notes (Signed)
   Follow-Up Visit   Subjective  Jon Ewing is a 75 y.o. male who presents for the following: Skin Problem (New lump on back x 2 months- no itch no bleed).  Possibly new lump on on back and several other areas to check Location:  Duration:  Quality:  Associated Signs/Symptoms: Modifying Factors:  Severity:  Timing: Context:   Objective  Well appearing patient in no apparent distress; mood and affect are within normal limits. Right Lower Back Subcutaneous doughy noninflamed 2 centimeter nodule     Left Hand - Posterior, Right Hand - Posterior History of easy bruising and thin skin limited to the forearms and dorsal hands.  Denies other areas of abnormal bleeding.  Right Supraorbital Region 3 mm deep dermal noninflamed white papule  Left Forehead, Left Malar Cheek Gritty pink 3 mm crusts  Left Lower Back 8 mm waxy pink crust         All skin waist up examined.   Assessment & Plan    Lipoma of torso Right Lower Back  May leave if stable  Solar purpura (HCC) (2) Left Hand - Posterior; Right Hand - Posterior  Dermend over-the-counter apply daily after bathing to areas on arms prone to bruise  Epidermal cyst Right Supraorbital Region  May schedule elective 30-minute surgery or leave if stable  AK (actinic keratosis) (2) Left Malar Cheek; Left Forehead  Destruction of lesion - Left Forehead, Left Malar Cheek Complexity: simple   Destruction method: cryotherapy   Informed consent: discussed and consent obtained   Lesion destroyed using liquid nitrogen: Yes   Cryotherapy cycles:  3 Outcome: patient tolerated procedure well with no complications    Basal cell carcinoma (BCC) of skin of other part of torso Left Lower Back  Skin / nail biopsy Type of biopsy: tangential   Informed consent: discussed and consent obtained   Timeout: patient name, date of birth, surgical site, and procedure verified   Procedure prep:  Patient was prepped and  draped in usual sterile fashion (Non sterile) Prep type:  Chlorhexidine Anesthesia: the lesion was anesthetized in a standard fashion   Anesthetic:  1% lidocaine w/ epinephrine 1-100,000 local infiltration Instrument used: flexible razor blade   Outcome: patient tolerated procedure well   Post-procedure details: wound care instructions given    Destruction of lesion Complexity: simple   Destruction method: electrodesiccation and curettage   Informed consent: discussed and consent obtained   Timeout:  patient name, date of birth, surgical site, and procedure verified Anesthesia: the lesion was anesthetized in a standard fashion   Anesthetic:  1% lidocaine w/ epinephrine 1-100,000 local infiltration Curettage performed in three different directions: Yes   Electrodesiccation performed over the curetted area: Yes   Curettage cycles:  1 Lesion length (cm):  1 Lesion width (cm):  1.1 Margin per side (cm):  0 Final wound size (cm):  1.1 Hemostasis achieved with:  aluminum chloride Outcome: patient tolerated procedure well with no complications   Post-procedure details: wound care instructions given    Specimen 1 - Surgical pathology Differential Diagnosis: bcc vs scc-txpbx  Check Margins: No  After shave biopsy the base of the lesion was treated with curettage plus cautery      I, Jon Monarch, MD, have reviewed all documentation for this visit.  The documentation on 04/11/21 for the exam, diagnosis, procedures, and orders are all accurate and complete.

## 2021-04-20 DIAGNOSIS — M25561 Pain in right knee: Secondary | ICD-10-CM | POA: Diagnosis not present

## 2021-04-20 DIAGNOSIS — S83231D Complex tear of medial meniscus, current injury, right knee, subsequent encounter: Secondary | ICD-10-CM | POA: Diagnosis not present

## 2021-04-20 DIAGNOSIS — M25661 Stiffness of right knee, not elsewhere classified: Secondary | ICD-10-CM | POA: Diagnosis not present

## 2021-04-20 DIAGNOSIS — S83271D Complex tear of lateral meniscus, current injury, right knee, subsequent encounter: Secondary | ICD-10-CM | POA: Diagnosis not present

## 2021-04-20 DIAGNOSIS — M6281 Muscle weakness (generalized): Secondary | ICD-10-CM | POA: Diagnosis not present

## 2021-04-23 DIAGNOSIS — M25661 Stiffness of right knee, not elsewhere classified: Secondary | ICD-10-CM | POA: Diagnosis not present

## 2021-04-23 DIAGNOSIS — S83231D Complex tear of medial meniscus, current injury, right knee, subsequent encounter: Secondary | ICD-10-CM | POA: Diagnosis not present

## 2021-04-23 DIAGNOSIS — S83271D Complex tear of lateral meniscus, current injury, right knee, subsequent encounter: Secondary | ICD-10-CM | POA: Diagnosis not present

## 2021-04-23 DIAGNOSIS — M25561 Pain in right knee: Secondary | ICD-10-CM | POA: Diagnosis not present

## 2021-04-23 DIAGNOSIS — M6281 Muscle weakness (generalized): Secondary | ICD-10-CM | POA: Diagnosis not present

## 2021-04-25 DIAGNOSIS — S83231D Complex tear of medial meniscus, current injury, right knee, subsequent encounter: Secondary | ICD-10-CM | POA: Diagnosis not present

## 2021-04-25 DIAGNOSIS — M25661 Stiffness of right knee, not elsewhere classified: Secondary | ICD-10-CM | POA: Diagnosis not present

## 2021-04-25 DIAGNOSIS — M6281 Muscle weakness (generalized): Secondary | ICD-10-CM | POA: Diagnosis not present

## 2021-04-25 DIAGNOSIS — M25561 Pain in right knee: Secondary | ICD-10-CM | POA: Diagnosis not present

## 2021-04-25 DIAGNOSIS — S83271D Complex tear of lateral meniscus, current injury, right knee, subsequent encounter: Secondary | ICD-10-CM | POA: Diagnosis not present

## 2021-05-02 DIAGNOSIS — M6281 Muscle weakness (generalized): Secondary | ICD-10-CM | POA: Diagnosis not present

## 2021-05-02 DIAGNOSIS — M25661 Stiffness of right knee, not elsewhere classified: Secondary | ICD-10-CM | POA: Diagnosis not present

## 2021-05-02 DIAGNOSIS — S83271D Complex tear of lateral meniscus, current injury, right knee, subsequent encounter: Secondary | ICD-10-CM | POA: Diagnosis not present

## 2021-05-02 DIAGNOSIS — M25561 Pain in right knee: Secondary | ICD-10-CM | POA: Diagnosis not present

## 2021-05-02 DIAGNOSIS — S83231D Complex tear of medial meniscus, current injury, right knee, subsequent encounter: Secondary | ICD-10-CM | POA: Diagnosis not present

## 2021-05-18 DIAGNOSIS — S83271D Complex tear of lateral meniscus, current injury, right knee, subsequent encounter: Secondary | ICD-10-CM | POA: Diagnosis not present

## 2021-05-18 DIAGNOSIS — M6281 Muscle weakness (generalized): Secondary | ICD-10-CM | POA: Diagnosis not present

## 2021-05-18 DIAGNOSIS — M25661 Stiffness of right knee, not elsewhere classified: Secondary | ICD-10-CM | POA: Diagnosis not present

## 2021-05-18 DIAGNOSIS — S83231D Complex tear of medial meniscus, current injury, right knee, subsequent encounter: Secondary | ICD-10-CM | POA: Diagnosis not present

## 2021-05-18 DIAGNOSIS — M25561 Pain in right knee: Secondary | ICD-10-CM | POA: Diagnosis not present

## 2021-07-09 DIAGNOSIS — M79651 Pain in right thigh: Secondary | ICD-10-CM | POA: Diagnosis not present

## 2021-07-09 DIAGNOSIS — M7631 Iliotibial band syndrome, right leg: Secondary | ICD-10-CM | POA: Diagnosis not present

## 2021-07-10 DIAGNOSIS — M7631 Iliotibial band syndrome, right leg: Secondary | ICD-10-CM | POA: Diagnosis not present

## 2021-07-10 DIAGNOSIS — M79651 Pain in right thigh: Secondary | ICD-10-CM | POA: Diagnosis not present

## 2021-07-11 ENCOUNTER — Ambulatory Visit: Payer: Medicare PPO | Admitting: Dermatology

## 2021-07-16 DIAGNOSIS — M7631 Iliotibial band syndrome, right leg: Secondary | ICD-10-CM | POA: Diagnosis not present

## 2021-07-16 DIAGNOSIS — M79651 Pain in right thigh: Secondary | ICD-10-CM | POA: Diagnosis not present

## 2021-07-20 DIAGNOSIS — M7631 Iliotibial band syndrome, right leg: Secondary | ICD-10-CM | POA: Diagnosis not present

## 2021-07-20 DIAGNOSIS — M79651 Pain in right thigh: Secondary | ICD-10-CM | POA: Diagnosis not present

## 2021-07-24 DIAGNOSIS — C61 Malignant neoplasm of prostate: Secondary | ICD-10-CM | POA: Diagnosis not present

## 2021-08-07 DIAGNOSIS — C61 Malignant neoplasm of prostate: Secondary | ICD-10-CM | POA: Diagnosis not present

## 2021-08-07 DIAGNOSIS — R102 Pelvic and perineal pain: Secondary | ICD-10-CM | POA: Diagnosis not present

## 2021-08-07 DIAGNOSIS — R3915 Urgency of urination: Secondary | ICD-10-CM | POA: Diagnosis not present

## 2021-08-20 DIAGNOSIS — M7631 Iliotibial band syndrome, right leg: Secondary | ICD-10-CM | POA: Diagnosis not present

## 2021-08-20 DIAGNOSIS — M79651 Pain in right thigh: Secondary | ICD-10-CM | POA: Diagnosis not present

## 2021-08-22 DIAGNOSIS — R3915 Urgency of urination: Secondary | ICD-10-CM | POA: Diagnosis not present

## 2021-08-22 DIAGNOSIS — M62838 Other muscle spasm: Secondary | ICD-10-CM | POA: Diagnosis not present

## 2021-09-03 DIAGNOSIS — M2242 Chondromalacia patellae, left knee: Secondary | ICD-10-CM | POA: Diagnosis not present

## 2021-09-03 DIAGNOSIS — M2241 Chondromalacia patellae, right knee: Secondary | ICD-10-CM | POA: Diagnosis not present

## 2021-09-05 ENCOUNTER — Ambulatory Visit: Payer: Medicare PPO | Admitting: Dermatology

## 2021-09-05 ENCOUNTER — Encounter: Payer: Self-pay | Admitting: Dermatology

## 2021-09-05 DIAGNOSIS — L570 Actinic keratosis: Secondary | ICD-10-CM

## 2021-09-05 DIAGNOSIS — L729 Follicular cyst of the skin and subcutaneous tissue, unspecified: Secondary | ICD-10-CM

## 2021-09-05 DIAGNOSIS — D1801 Hemangioma of skin and subcutaneous tissue: Secondary | ICD-10-CM

## 2021-09-05 DIAGNOSIS — C44612 Basal cell carcinoma of skin of right upper limb, including shoulder: Secondary | ICD-10-CM | POA: Diagnosis not present

## 2021-09-05 DIAGNOSIS — L820 Inflamed seborrheic keratosis: Secondary | ICD-10-CM

## 2021-09-05 DIAGNOSIS — Z1283 Encounter for screening for malignant neoplasm of skin: Secondary | ICD-10-CM

## 2021-09-05 DIAGNOSIS — Z85828 Personal history of other malignant neoplasm of skin: Secondary | ICD-10-CM

## 2021-09-05 DIAGNOSIS — C44519 Basal cell carcinoma of skin of other part of trunk: Secondary | ICD-10-CM

## 2021-09-05 DIAGNOSIS — D485 Neoplasm of uncertain behavior of skin: Secondary | ICD-10-CM

## 2021-09-05 NOTE — Patient Instructions (Signed)

## 2021-09-10 ENCOUNTER — Telehealth: Payer: Self-pay | Admitting: *Deleted

## 2021-09-10 DIAGNOSIS — M79651 Pain in right thigh: Secondary | ICD-10-CM | POA: Diagnosis not present

## 2021-09-10 DIAGNOSIS — M7631 Iliotibial band syndrome, right leg: Secondary | ICD-10-CM | POA: Diagnosis not present

## 2021-09-10 NOTE — Telephone Encounter (Signed)
Path to patient. Made a surgery to have cyst removed.  ?

## 2021-09-10 NOTE — Telephone Encounter (Signed)
-----   Message from [name], MD sent at 09/07/2021  5:49 PM EDT ----- ?Mr. Force had a nonmelanoma skin cancer on the back of his shoulder which was treated with the biopsy.  The other spot was not a skin cancer (second, path report comment below).  If the right shoulder heals smooth he can return for routine check in 1 year. ?

## 2021-09-13 DIAGNOSIS — I358 Other nonrheumatic aortic valve disorders: Secondary | ICD-10-CM | POA: Diagnosis not present

## 2021-09-13 DIAGNOSIS — K219 Gastro-esophageal reflux disease without esophagitis: Secondary | ICD-10-CM | POA: Diagnosis not present

## 2021-09-13 DIAGNOSIS — J309 Allergic rhinitis, unspecified: Secondary | ICD-10-CM | POA: Diagnosis not present

## 2021-09-13 DIAGNOSIS — E7800 Pure hypercholesterolemia, unspecified: Secondary | ICD-10-CM | POA: Diagnosis not present

## 2021-09-13 DIAGNOSIS — Z0000 Encounter for general adult medical examination without abnormal findings: Secondary | ICD-10-CM | POA: Diagnosis not present

## 2021-09-13 DIAGNOSIS — G2581 Restless legs syndrome: Secondary | ICD-10-CM | POA: Diagnosis not present

## 2021-09-13 DIAGNOSIS — M25569 Pain in unspecified knee: Secondary | ICD-10-CM | POA: Diagnosis not present

## 2021-09-22 ENCOUNTER — Encounter: Payer: Self-pay | Admitting: Dermatology

## 2021-09-22 NOTE — Progress Notes (Signed)
? ?Follow-Up Visit ?  ?Subjective  ?Jon Ewing is a 76 y.o. male who presents for the following: Annual Exam (Full body exam. Personal history of bcc and scc. ). ? ?General skin examination, possible new spots on back and history of nonmelanoma skin cancers ?Location:  ?Duration:  ?Quality:  ?Associated Signs/Symptoms: ?Modifying Factors:  ?Severity:  ?Timing: ?Context:  ? ?Objective  ?Well appearing patient in no apparent distress; mood and affect are within normal limits. ?Full body skin examination: No atypical pigmented lesions (all checked with dermoscopy).  2 possible new nonmelanoma skin cancers on the shoulder and the back. ? ?Left Lower Back ?No sign of recurrence, hypopigmented scar ? ?Right Lower Back ?1.2 cm waxy crust superficial carcinoma versus irritated keratosis ? ? ? ? ? ? ?Right Supraorbital Region ?4 mm noninflamed white dermal papule ? ?Left Forehead, Left Upper Back ?Grady and horny 3 to 5 mm pink crusts ? ?Left Abdomen (side) - Upper ?Multiple smooth red 1 mm dermal papules ? ?Right Shoulder - Anterior ?1 cm waxy pink nodule ? ? ? ? ? ? ?A full examination was performed including scalp, head, eyes, ears, nose, lips, neck, chest, axillae, abdomen, back, buttocks, bilateral upper extremities, bilateral lower extremities, hands, feet, fingers, toes, fingernails, and toenails. All findings within normal limits unless otherwise noted below. ? ? ?Assessment & Plan  ? ? ?History of basal cell carcinoma (BCC) ?Left Lower Back ? ?Check if there is clinical change ? ?Encounter for screening for malignant neoplasm of skin ? ?Annual skin examination. ? ?Neoplasm of uncertain behavior of skin ?Right Lower Back ? ?Skin / nail biopsy ?Type of biopsy: tangential   ?Informed consent: discussed and consent obtained   ?Timeout: patient name, date of birth, surgical site, and procedure verified   ?Anesthesia: the lesion was anesthetized in a standard fashion   ?Anesthetic:  1% lidocaine w/ epinephrine  1-100,000 local infiltration ?Instrument used: flexible razor blade   ?Hemostasis achieved with: ferric subsulfate and electrodesiccation   ?Outcome: patient tolerated procedure well   ?Post-procedure details: wound care instructions given   ? ?Specimen 2 - Surgical pathology ?Differential Diagnosis: r/o isk- cautery only ? ?Check Margins: No ? ?Cyst of skin ?Right Supraorbital Region ? ?Patient may choose to schedule excision in the future. ? ?AK (actinic keratosis) (2) ?Left Upper Back; Left Forehead ? ?Destruction of lesion - Left Forehead, Left Upper Back ?Complexity: simple   ?Destruction method: cryotherapy   ?Informed consent: discussed and consent obtained   ?Timeout:  patient name, date of birth, surgical site, and procedure verified ?Lesion destroyed using liquid nitrogen: Yes   ?Cryotherapy cycles:  3 ?Outcome: patient tolerated procedure well with no complications   ?Post-procedure details: wound care instructions given   ? ?Cherry angioma ?Left Abdomen (side) - Upper ? ?No intervention necessary ? ?Basal cell carcinoma, trunk ?Right Shoulder - Anterior ? ?Skin / nail biopsy ?Type of biopsy: tangential   ?Informed consent: discussed and consent obtained   ?Timeout: patient name, date of birth, surgical site, and procedure verified   ?Anesthesia: the lesion was anesthetized in a standard fashion   ?Anesthetic:  1% lidocaine w/ epinephrine 1-100,000 local infiltration ?Instrument used: flexible razor blade   ?Hemostasis achieved with: aluminum chloride and electrodesiccation   ?Outcome: patient tolerated procedure well   ?Post-procedure details: wound care instructions given   ? ?Destruction of lesion ?Complexity: simple   ?Destruction method: electrodesiccation and curettage   ?Informed consent: discussed and consent obtained   ?Timeout:  patient  name, date of birth, surgical site, and procedure verified ?Anesthesia: the lesion was anesthetized in a standard fashion   ?Anesthetic:  1% lidocaine w/  epinephrine 1-100,000 local infiltration ?Curettage performed in three different directions: Yes   ?Electrodesiccation performed over the curetted area: Yes   ?Curettage cycles:  3 ?Lesion length (cm):  1.1 ?Lesion width (cm):  1.1 ?Margin per side (cm):  0 ?Final wound size (cm):  1.1 ?Hemostasis achieved with:  ferric subsulfate ?Outcome: patient tolerated procedure well with no complications   ?Post-procedure details: wound care instructions given   ? ?Specimen 1 - Surgical pathology ?Differential Diagnosis: bcc vs scc- txpbx  ? ?Check Margins: No ? ?After shave biopsy the base was treated with curettage plus cautery. ? ? ? ? ? ?I, [name], MD, have reviewed all documentation for this visit.  The documentation on 09/22/21 for the exam, diagnosis, procedures, and orders are all accurate and complete. ?

## 2021-11-27 DIAGNOSIS — H2513 Age-related nuclear cataract, bilateral: Secondary | ICD-10-CM | POA: Diagnosis not present

## 2021-11-27 DIAGNOSIS — H5203 Hypermetropia, bilateral: Secondary | ICD-10-CM | POA: Diagnosis not present

## 2021-11-27 DIAGNOSIS — H52222 Regular astigmatism, left eye: Secondary | ICD-10-CM | POA: Diagnosis not present

## 2021-11-27 DIAGNOSIS — H53002 Unspecified amblyopia, left eye: Secondary | ICD-10-CM | POA: Diagnosis not present

## 2021-11-27 DIAGNOSIS — H524 Presbyopia: Secondary | ICD-10-CM | POA: Diagnosis not present

## 2021-11-29 ENCOUNTER — Ambulatory Visit (INDEPENDENT_AMBULATORY_CARE_PROVIDER_SITE_OTHER): Payer: Medicare PPO | Admitting: Dermatology

## 2021-11-29 ENCOUNTER — Encounter: Payer: Self-pay | Admitting: Dermatology

## 2021-11-29 DIAGNOSIS — D692 Other nonthrombocytopenic purpura: Secondary | ICD-10-CM | POA: Diagnosis not present

## 2021-11-29 DIAGNOSIS — D485 Neoplasm of uncertain behavior of skin: Secondary | ICD-10-CM

## 2021-11-29 DIAGNOSIS — L720 Epidermal cyst: Secondary | ICD-10-CM | POA: Diagnosis not present

## 2021-11-29 NOTE — Patient Instructions (Addendum)
Sleeves for the arm and/or DerMend lotion   Biopsy, Surgery (Curettage) & Surgery (Excision) Aftercare Instructions  1. Okay to remove bandage in 24 hours  2. Wash area with soap and water  3. Apply Vaseline to area twice daily until healed (Not Neosporin)  4. Okay to cover with a Band-Aid to decrease the chance of infection or prevent irritation from clothing; also it's okay to uncover lesion at home.  5. Suture instructions: return to our office in 7-10 or 10-14 days for a nurse visit for suture removal. Variable healing with sutures, if pain or itching occurs call our office. It's okay to shower or bathe 24 hours after sutures are given.  6. The following risks may occur after a biopsy, curettage or excision: bleeding, scarring, discoloration, recurrence, infection (redness, yellow drainage, pain or swelling).  7. For questions, concerns and results call our office at [place] before 4pm & Friday before 3pm. Biopsy results will be available in 1 week.

## 2021-12-22 ENCOUNTER — Encounter: Payer: Self-pay | Admitting: Dermatology

## 2021-12-22 NOTE — Progress Notes (Signed)
   Follow-Up Visit   Subjective  Jon Ewing is a 76 y.o. male who presents for the following: Procedure (Cyst or skin tag on the ey lid ).  Growth right upper eyelid, bruising on arms Location:  Duration:  Quality:  Associated Signs/Symptoms: Modifying Factors:  Severity:  Timing: Context:   Objective  Well appearing patient in no apparent distress; mood and affect are within normal limits. Right Upper Eyelid Pearly 4 mm papule right upper eyelid, has grown  Left Forearm - Posterior Several forearm ecchymoses, denies other abnormal bleeding    A focused examination was performed including head, neck, arms. Relevant physical exam findings are noted in the Assessment and Plan.   Assessment & Plan    Neoplasm of uncertain behavior of skin Right Upper Eyelid  Skin / nail biopsy Type of biopsy: tangential   Informed consent: discussed and consent obtained   Timeout: patient name, date of birth, surgical site, and procedure verified   Anesthesia: the lesion was anesthetized in a standard fashion   Anesthetic:  1% lidocaine w/ epinephrine 1-100,000 local infiltration Instrument used: flexible razor blade   Hemostasis achieved with: aluminum chloride and electrodesiccation   Outcome: patient tolerated procedure well   Post-procedure details: wound care instructions given    Specimen 1 - Surgical pathology Differential Diagnosis: hydro cyst v/s milia  Check Margins: No  If biopsy showed a keratinous pearl compatible with a small epidermoid cyst.  The base was lightly cauterized  Solar purpura ([place]) Left Forearm - Posterior  Over the counter Dermend       I, [name], MD, have reviewed all documentation for this visit.  The documentation on 12/22/21 for the exam, diagnosis, procedures, and orders are all accurate and complete.

## 2022-01-31 DIAGNOSIS — C61 Malignant neoplasm of prostate: Secondary | ICD-10-CM | POA: Diagnosis not present

## 2022-02-05 DIAGNOSIS — C61 Malignant neoplasm of prostate: Secondary | ICD-10-CM | POA: Diagnosis not present

## 2022-02-07 IMAGING — MR MR PROSTATE WO/W CM
12 series · 48 of 48 positions shown · IV contrast (multihance)
Comparison: None.

CLINICAL DATA: Prostate cancer, PSA 1.67, benign biopsy in 6223

EXAM:
MR PROSTATE WITHOUT AND WITH CONTRAST
TECHNIQUE: Multiplanar multisequence MRI images were obtained of the pelvis
centered about the prostate. Pre and post contrast images were
obtained.
CONTRAST:  15mL MULTIHANCE GADOBENATE DIMEGLUMINE 529 MG/ML IV SOLN

[Series 3: T2 · coronal · 3.0mm · 0.56mm/px · 1 of 23 slices shown (1 of 3)]
[im 1/23]
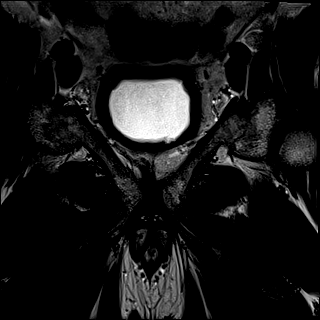

[Series 4: T1 · axial · 5.0mm · 1.25mm/px · 1 of 88 slices shown]
[im 1/88]
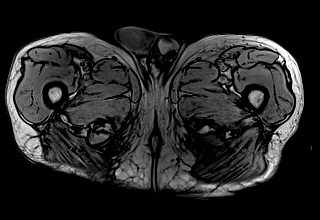

[Series 5: DWI · axial · 3.0mm · 1.75mm/px · 1 of 75 slices shown (1 of 3)]
[im 1/75]
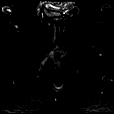

[Series 6: DWI · axial · 3.0mm · 1.75mm/px · 1 of 25 slices shown (2 of 3)]
[im 1/25]
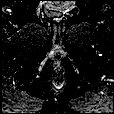

[Series 7: DWI · axial · 3.0mm · 1.75mm/px · 1 of 25 slices shown (3 of 3)]
[im 1/25]
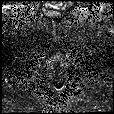

[Series 8: T2 · axial · 3.0mm · 0.56mm/px · 1 of 25 slices shown (2 of 3)]
[im 1/25]
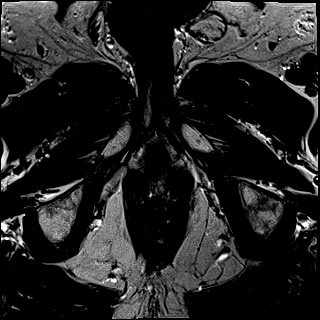

[Series 9: T2 · axial · 1.0mm · 1.04mm/px · z∈[-42,+37]mm · 2 of 80 slices shown (3 of 3)]
[im 1/80]
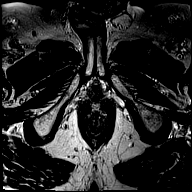
[im 80/80]
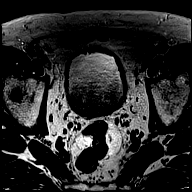

[Series 10: pre t1_twist_tra_dyn · axial · non-contrast · 3.5mm · 0.83mm/px · 1 of 22 slices shown]
[im 1/22]
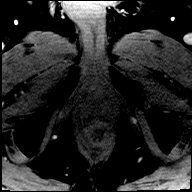

[Series 11: post t1_twist_tra_dyn-copy center · axial · non-contrast · 3.5mm · 0.83mm/px · z∈[-44,+36]mm · 18 of 720 slices shown]
[im 1/720]
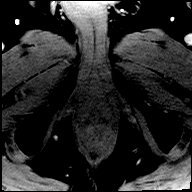
[im 43/720]
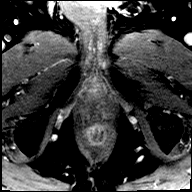
[im 85/720]
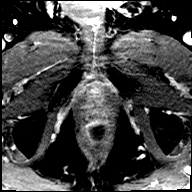
[im 127/720]
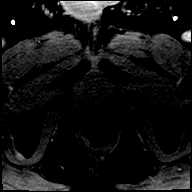
[im 170/720]
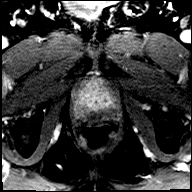
[im 212/720]
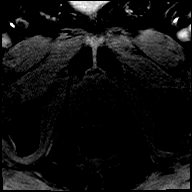
[im 254/720]
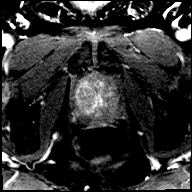
[im 297/720]
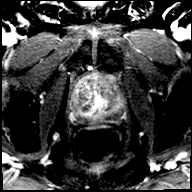
[im 339/720]
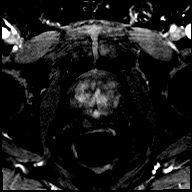
[im 381/720]
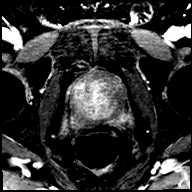
[im 423/720]
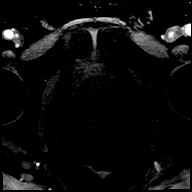
[im 466/720]
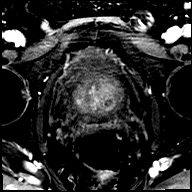
[im 508/720]
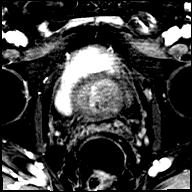
[im 550/720]
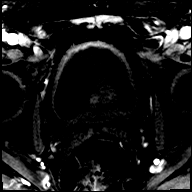
[im 593/720]
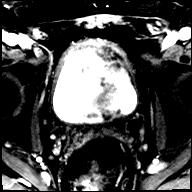
[im 635/720]
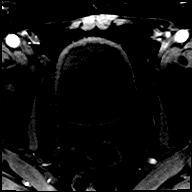
[im 677/720]
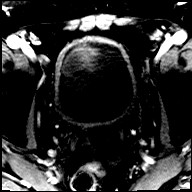
[im 720/720]
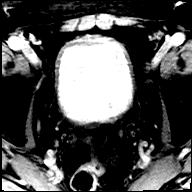

[Series 12: post t1_twist_tra_dyn-copy cent_sub · axial · 3.5mm · 0.83mm/px · z∈[-44,+36]mm · 17 of 692 slices shown]
[im 1/692]
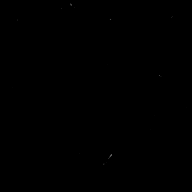
[im 44/692]
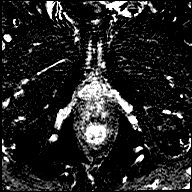
[im 87/692]
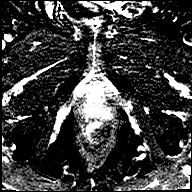
[im 130/692]
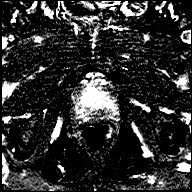
[im 173/692]
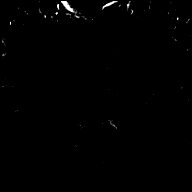
[im 216/692]
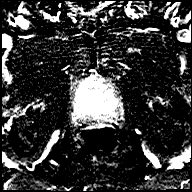
[im 260/692]
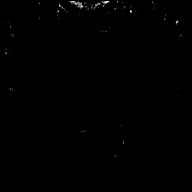
[im 303/692]
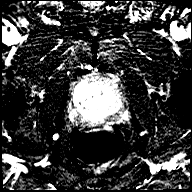
[im 346/692]
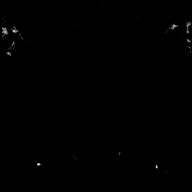
[im 389/692]
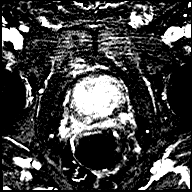
[im 432/692]
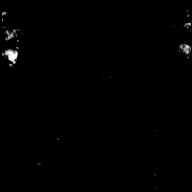
[im 476/692]
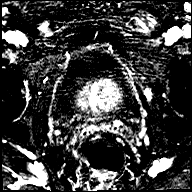
[im 519/692]
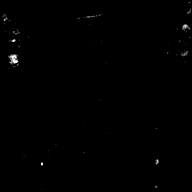
[im 562/692]
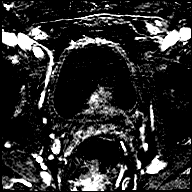
[im 605/692]
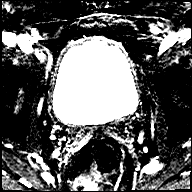
[im 648/692]
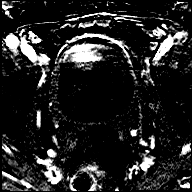
[im 692/692]
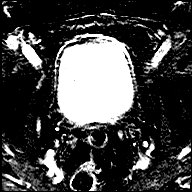

[Series 13: t1_vibe_dixon_tra_f · axial · 2.5mm · 0.91mm/px · z∈[-71,+126]mm · 2 of 80 slices shown]
[im 1/80]
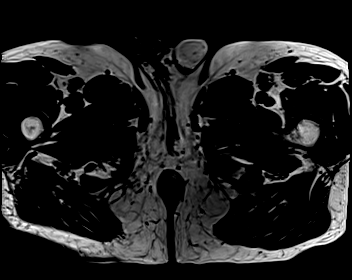
[im 80/80]
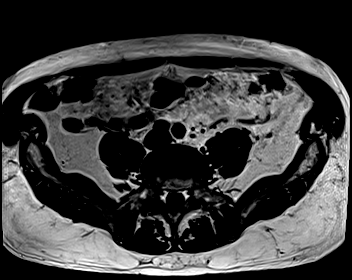

[Series 14: t1_vibe_dixon_tra_w · axial · 2.5mm · 0.91mm/px · z∈[-71,+126]mm · 2 of 80 slices shown]
[im 1/80]
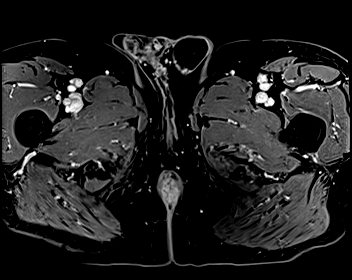
[im 80/80]
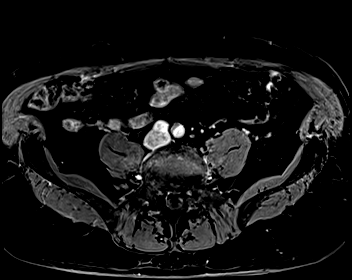

[48 of 48 positions shown; findings below may reference images not displayed]

FINDINGS: Prostate: No findings suspicious for high-grade macroscopic prostate
cancer. Specifically, no suspicious focal T2 lesion. An 8 mm nodule
along the right base (series 8/image 11) has the characteristic
appearance of an exophytic central gland nodule. No restricted
diffusion/low ADC. No early arterial enhancement.

Enlargement/nodularity of the central gland, reflecting BPH. No
suspicious central gland nodule on T2.

Volume: 4.1 x 4.5 x 5.9 cm (volume = 57 mL)

Transcapsular spread:  Absent

Seminal vesicle involvement: Absent

Neurovascular bundle involvement: Absent

Pelvic adenopathy: Absent

Bone metastasis: Absent

Other findings: Mildly thick-walled, trabeculated bladder.
IMPRESSION: No findings suspicious for high-grade macroscopic prostate cancer on
MRI.

No evidence of extracapsular extension, seminal vesicle invasion, or
metastatic disease.

BPH.  Calculated prostate volume 57 mL.

## 2022-03-19 DIAGNOSIS — J310 Chronic rhinitis: Secondary | ICD-10-CM | POA: Diagnosis not present

## 2022-03-19 DIAGNOSIS — K219 Gastro-esophageal reflux disease without esophagitis: Secondary | ICD-10-CM | POA: Diagnosis not present

## 2022-03-19 DIAGNOSIS — H9190 Unspecified hearing loss, unspecified ear: Secondary | ICD-10-CM | POA: Diagnosis not present

## 2022-05-06 DIAGNOSIS — M19011 Primary osteoarthritis, right shoulder: Secondary | ICD-10-CM | POA: Diagnosis not present

## 2022-05-06 DIAGNOSIS — M1711 Unilateral primary osteoarthritis, right knee: Secondary | ICD-10-CM | POA: Diagnosis not present

## 2022-05-30 DIAGNOSIS — M19011 Primary osteoarthritis, right shoulder: Secondary | ICD-10-CM | POA: Diagnosis not present

## 2022-06-03 DIAGNOSIS — M19011 Primary osteoarthritis, right shoulder: Secondary | ICD-10-CM | POA: Diagnosis not present

## 2022-06-03 DIAGNOSIS — M1711 Unilateral primary osteoarthritis, right knee: Secondary | ICD-10-CM | POA: Diagnosis not present

## 2022-06-06 DIAGNOSIS — M19011 Primary osteoarthritis, right shoulder: Secondary | ICD-10-CM | POA: Diagnosis not present

## 2022-09-16 DIAGNOSIS — M25569 Pain in unspecified knee: Secondary | ICD-10-CM | POA: Diagnosis not present

## 2022-09-16 DIAGNOSIS — K219 Gastro-esophageal reflux disease without esophagitis: Secondary | ICD-10-CM | POA: Diagnosis not present

## 2022-09-16 DIAGNOSIS — G2581 Restless legs syndrome: Secondary | ICD-10-CM | POA: Diagnosis not present

## 2022-09-16 DIAGNOSIS — E7800 Pure hypercholesterolemia, unspecified: Secondary | ICD-10-CM | POA: Diagnosis not present

## 2022-09-16 DIAGNOSIS — I358 Other nonrheumatic aortic valve disorders: Secondary | ICD-10-CM | POA: Diagnosis not present

## 2022-09-16 DIAGNOSIS — Z0000 Encounter for general adult medical examination without abnormal findings: Secondary | ICD-10-CM | POA: Diagnosis not present

## 2022-09-16 DIAGNOSIS — J309 Allergic rhinitis, unspecified: Secondary | ICD-10-CM | POA: Diagnosis not present

## 2022-10-29 DIAGNOSIS — K117 Disturbances of salivary secretion: Secondary | ICD-10-CM | POA: Diagnosis not present

## 2022-11-29 DIAGNOSIS — E7800 Pure hypercholesterolemia, unspecified: Secondary | ICD-10-CM | POA: Diagnosis not present

## 2022-11-29 DIAGNOSIS — H52223 Regular astigmatism, bilateral: Secondary | ICD-10-CM | POA: Diagnosis not present

## 2022-11-29 DIAGNOSIS — H524 Presbyopia: Secondary | ICD-10-CM | POA: Diagnosis not present

## 2022-11-29 DIAGNOSIS — H2513 Age-related nuclear cataract, bilateral: Secondary | ICD-10-CM | POA: Diagnosis not present

## 2022-11-29 DIAGNOSIS — H5203 Hypermetropia, bilateral: Secondary | ICD-10-CM | POA: Diagnosis not present

## 2022-11-29 DIAGNOSIS — T7840XD Allergy, unspecified, subsequent encounter: Secondary | ICD-10-CM | POA: Diagnosis not present

## 2022-11-29 DIAGNOSIS — H53143 Visual discomfort, bilateral: Secondary | ICD-10-CM | POA: Diagnosis not present

## 2022-11-29 DIAGNOSIS — H53002 Unspecified amblyopia, left eye: Secondary | ICD-10-CM | POA: Diagnosis not present

## 2022-12-11 DIAGNOSIS — M19011 Primary osteoarthritis, right shoulder: Secondary | ICD-10-CM | POA: Diagnosis not present

## 2022-12-25 DIAGNOSIS — L814 Other melanin hyperpigmentation: Secondary | ICD-10-CM | POA: Diagnosis not present

## 2022-12-25 DIAGNOSIS — D225 Melanocytic nevi of trunk: Secondary | ICD-10-CM | POA: Diagnosis not present

## 2022-12-25 DIAGNOSIS — L821 Other seborrheic keratosis: Secondary | ICD-10-CM | POA: Diagnosis not present

## 2022-12-25 DIAGNOSIS — Z85828 Personal history of other malignant neoplasm of skin: Secondary | ICD-10-CM | POA: Diagnosis not present

## 2022-12-25 DIAGNOSIS — L820 Inflamed seborrheic keratosis: Secondary | ICD-10-CM | POA: Diagnosis not present

## 2022-12-25 DIAGNOSIS — L570 Actinic keratosis: Secondary | ICD-10-CM | POA: Diagnosis not present

## 2022-12-25 DIAGNOSIS — D0359 Melanoma in situ of other part of trunk: Secondary | ICD-10-CM | POA: Diagnosis not present

## 2022-12-25 DIAGNOSIS — L738 Other specified follicular disorders: Secondary | ICD-10-CM | POA: Diagnosis not present

## 2022-12-25 DIAGNOSIS — D485 Neoplasm of uncertain behavior of skin: Secondary | ICD-10-CM | POA: Diagnosis not present

## 2023-01-08 DIAGNOSIS — K117 Disturbances of salivary secretion: Secondary | ICD-10-CM | POA: Diagnosis not present

## 2023-01-21 DIAGNOSIS — D0359 Melanoma in situ of other part of trunk: Secondary | ICD-10-CM | POA: Diagnosis not present

## 2023-01-21 DIAGNOSIS — L821 Other seborrheic keratosis: Secondary | ICD-10-CM | POA: Diagnosis not present

## 2023-02-08 ENCOUNTER — Emergency Department (HOSPITAL_COMMUNITY)
Admission: EM | Admit: 2023-02-08 | Discharge: 2023-02-08 | Disposition: A | Discharge status: Home / Self Care | Payer: Medicare PPO | Attending: Emergency Medicine | Admitting: Emergency Medicine

## 2023-02-08 ENCOUNTER — Encounter (HOSPITAL_COMMUNITY): Payer: Self-pay

## 2023-02-08 ENCOUNTER — Other Ambulatory Visit: Payer: Self-pay

## 2023-02-08 ENCOUNTER — Emergency Department (HOSPITAL_COMMUNITY): Payer: Medicare PPO

## 2023-02-08 DIAGNOSIS — U071 COVID-19: Secondary | ICD-10-CM | POA: Diagnosis not present

## 2023-02-08 DIAGNOSIS — R059 Cough, unspecified: Secondary | ICD-10-CM | POA: Diagnosis not present

## 2023-02-08 DIAGNOSIS — K222 Esophageal obstruction: Secondary | ICD-10-CM | POA: Diagnosis not present

## 2023-02-08 DIAGNOSIS — R1310 Dysphagia, unspecified: Secondary | ICD-10-CM | POA: Insufficient documentation

## 2023-02-08 DIAGNOSIS — R1319 Other dysphagia: Secondary | ICD-10-CM

## 2023-02-08 MED ORDER — DEXAMETHASONE 4 MG PO TABS: 4.0000 mg | ORAL_TABLET | Freq: Every day | ORAL | 0 refills | Status: DC

## 2023-02-08 MED ORDER — DEXAMETHASONE 4 MG PO TABS: 4.0000 mg | ORAL_TABLET | Freq: Every day | ORAL | 0 refills | Status: AC

## 2023-02-08 NOTE — ED Triage Notes (Signed)
Pt states c/o dysphagia. Pt states he tested positive for COVID Wednesday of this week but is having  difficulty swallowing medication. Pt endorses increased secretions.

## 2023-02-08 NOTE — ED Provider Notes (Signed)
[place] EMERGENCY DEPARTMENT AT [facility] Provider Note   CSN: [id number] Arrival date & time: 02/08/23  1551     History Chief Complaint  Patient presents with   Dysphagia    HPI Jon Ewing is a 77 y.o. male presenting for chief complaint of dysphagia. The patient presents with difficulty swallowing pills, a burning sensation, and difficulty keeping things down. They tested positive for COVID on Wednesday and started Paxlovid on Thursday. The burning sensation with pills has been ongoing for the past week. The patient reports that solids, meats, and liquids get stuck three or four times. They have a history of oropharyngeal surgery, specifically uvula removal due to snoring, and deny having heartburn normally. The patient is currently on omeprazole 40 mg for dysphagia.  Patient's recorded medical, surgical, social, medication list and allergies were reviewed in the Snapshot window as part of the initial history.   Review of Systems   Review of Systems  Constitutional:  Negative for chills and fever.  HENT:  Negative for ear pain and sore throat.   Eyes:  Negative for pain and visual disturbance.  Respiratory:  Negative for cough and shortness of breath.   Cardiovascular:  Negative for chest pain and palpitations.  Gastrointestinal:  Negative for abdominal pain and vomiting.  Genitourinary:  Negative for dysuria and hematuria.  Musculoskeletal:  Negative for arthralgias and back pain.  Skin:  Negative for color change and rash.  Neurological:  Negative for seizures and syncope.  All other systems reviewed and are negative.   Physical Exam Updated Vital Signs BP 126/73 (BP Location: Left Arm)   Pulse 97   Temp 98.2 F (36.8 C) (Oral)   Resp 16   Ht 5\' 8"  (1.727 m)   Wt 74.4 kg   SpO2 94%   BMI 24.94 kg/m  Physical Exam Vitals and nursing note reviewed.  Constitutional:      General: He is not in acute distress.    Appearance: He is well-developed.   HENT:     Head: Normocephalic and atraumatic.  Eyes:     Conjunctiva/sclera: Conjunctivae normal.  Cardiovascular:     Rate and Rhythm: Normal rate and regular rhythm.     Heart sounds: No murmur heard. Pulmonary:     Effort: Pulmonary effort is normal. No respiratory distress.     Breath sounds: Normal breath sounds.  Abdominal:     Palpations: Abdomen is soft.     Tenderness: There is no abdominal tenderness.  Musculoskeletal:        General: No swelling.     Cervical back: Neck supple.  Skin:    General: Skin is warm and dry.     Capillary Refill: Capillary refill takes less than 2 seconds.  Neurological:     Mental Status: He is alert.  Psychiatric:        Mood and Affect: Mood normal.      ED Course/ Medical Decision Making/ A&P    Procedures Procedures   Medications Ordered in ED Medications - No data to display  Medical Decision Making:    Jon Ewing is a 77 y.o. male who presented to the ED today with chief complaint dysphagia detailed above.     Complete initial physical exam performed, notably the patient  was hemodynamically stable in no acute distress.      Reviewed and confirmed nursing documentation for past medical history, family history, social history.    Initial Assessment:   With the  patient's presentation of dysphagia, most likely diagnosis is reflux esophagitis versus pill esophagitis given recent initiation of an antiviral. Other diagnoses were considered including (but not limited to) mediastinitis, achalasia. These are considered less likely due to history of present illness and physical exam findings.   This is most consistent with an acute life/limb threatening illness complicated by underlying chronic conditions.  Initial Plan:  CXR to evaluate for structural/infectious intrathoracic pathology.  Objective evaluation as below reviewed with plan for close reassessment  Initial Study Results:    Radiology  All images reviewed  independently. Agree with radiology report at this time.   DG Chest 2 View  Result Date: 02/08/2023 CLINICAL DATA:  COVID positive.  Cough EXAM: CHEST - 2 VIEW COMPARISON:  June 02, 2018 FINDINGS: The cardiomediastinal silhouette is unchanged in contour. No pleural effusion. No pneumothorax. Bibasilar predominant peribronchial cuffing bronchitic markings and mild interstitial prominence. Visualized abdomen is unremarkable. Multilevel degenerative changes of the thoracic spine. IMPRESSION: Constellation of findings are favored to reflect atypical infection in the setting of a positive COVID test. Differential considerations include pulmonary edema. Electronically Signed   By: Meda Klinefelter M.D.   On: 02/08/2023 17:27     Reassessment and Plan:   Will treat patient with increased dose of PPI omeprazole 40 mg twice daily for the next 5 days as well as Decadron 4 mg daily for oropharyngeal irritation in setting of coronavirus infection.  He will follow-up with his gastroenterologist in the outpatient setting if symptoms are persistent following the next 5 to 7 days or if he develops any intolerance p.o. intake he will return.   Disposition:  I have considered need for hospitalization, however, considering all of the above, I believe this patient is stable for discharge at this time.  Patient/family educated about specific return precautions for given chief complaint and symptoms.  Patient/family educated about follow-up with PC.     Patient/family expressed understanding of return precautions and need for follow-up. Patient spoken to regarding all imaging and laboratory results and appropriate follow up for these results. All education provided in verbal form with additional information in written form. Time was allowed for answering of patient questions. Patient discharged.    Emergency Department Medication Summary:   Medications - No data to display   Clinical Impression:  1. Esophageal  dysphagia   2. COVID      Discharge   Final Clinical Impression(s) / ED Diagnoses Final diagnoses:  Esophageal dysphagia  COVID    Rx / DC Orders ED Discharge Orders          Ordered    dexamethasone (DECADRON) 4 MG tablet  Daily        02/08/23 1739              Glyn Ade, MD 02/08/23 1739

## 2023-02-08 NOTE — ED Notes (Signed)
Opened chart at pts request to update pharmacy and have meds resent.

## 2023-02-10 DIAGNOSIS — R0989 Other specified symptoms and signs involving the circulatory and respiratory systems: Secondary | ICD-10-CM | POA: Diagnosis not present

## 2023-02-17 DIAGNOSIS — J019 Acute sinusitis, unspecified: Secondary | ICD-10-CM | POA: Diagnosis not present

## 2023-04-08 DIAGNOSIS — K5730 Diverticulosis of large intestine without perforation or abscess without bleeding: Secondary | ICD-10-CM | POA: Diagnosis not present

## 2023-04-08 DIAGNOSIS — Z860101 Personal history of adenomatous and serrated colon polyps: Secondary | ICD-10-CM | POA: Diagnosis not present

## 2023-04-08 DIAGNOSIS — Z800 Family history of malignant neoplasm of digestive organs: Secondary | ICD-10-CM | POA: Diagnosis not present

## 2023-04-08 DIAGNOSIS — Z09 Encounter for follow-up examination after completed treatment for conditions other than malignant neoplasm: Secondary | ICD-10-CM | POA: Diagnosis not present

## 2023-04-08 DIAGNOSIS — D123 Benign neoplasm of transverse colon: Secondary | ICD-10-CM | POA: Diagnosis not present

## 2023-04-08 DIAGNOSIS — D120 Benign neoplasm of cecum: Secondary | ICD-10-CM | POA: Diagnosis not present

## 2023-04-10 DIAGNOSIS — D123 Benign neoplasm of transverse colon: Secondary | ICD-10-CM | POA: Diagnosis not present

## 2023-04-10 DIAGNOSIS — D120 Benign neoplasm of cecum: Secondary | ICD-10-CM | POA: Diagnosis not present

## 2023-04-15 DIAGNOSIS — R202 Paresthesia of skin: Secondary | ICD-10-CM | POA: Diagnosis not present

## 2023-04-15 DIAGNOSIS — R200 Anesthesia of skin: Secondary | ICD-10-CM | POA: Diagnosis not present

## 2023-05-05 DIAGNOSIS — R3915 Urgency of urination: Secondary | ICD-10-CM | POA: Diagnosis not present

## 2023-05-05 DIAGNOSIS — C61 Malignant neoplasm of prostate: Secondary | ICD-10-CM | POA: Diagnosis not present

## 2023-05-20 ENCOUNTER — Ambulatory Visit (INDEPENDENT_AMBULATORY_CARE_PROVIDER_SITE_OTHER): Payer: Medicare PPO | Admitting: Otolaryngology

## 2023-05-20 ENCOUNTER — Encounter (INDEPENDENT_AMBULATORY_CARE_PROVIDER_SITE_OTHER): Payer: Self-pay

## 2023-05-20 VITALS — BP 137/78 | Ht 67.0 in | Wt 164.0 lb

## 2023-05-20 DIAGNOSIS — K117 Disturbances of salivary secretion: Secondary | ICD-10-CM | POA: Diagnosis not present

## 2023-05-20 DIAGNOSIS — R490 Dysphonia: Secondary | ICD-10-CM

## 2023-05-20 DIAGNOSIS — J328 Other chronic sinusitis: Secondary | ICD-10-CM

## 2023-05-20 DIAGNOSIS — Z9889 Other specified postprocedural states: Secondary | ICD-10-CM | POA: Diagnosis not present

## 2023-05-20 MED ORDER — SCOPOLAMINE 1 MG/3DAYS TD PT72: 1.0000 | MEDICATED_PATCH | TRANSDERMAL | 0 refills | Status: AC

## 2023-05-20 MED ORDER — IPRATROPIUM BROMIDE 0.06 % NA SOLN: 2.0000 | Freq: Two times a day (BID) | NASAL | 12 refills | Status: AC

## 2023-05-20 NOTE — Progress Notes (Signed)
 Dear Dr. Auston, Here is my assessment for our mutual patient, Precious Gilchrest. Thank you for allowing me the opportunity to care for your patient. Please do not hesitate to contact me should you have any other questions. Sincerely, Dr. Eldora Blanch  Otolaryngology Clinic Note Referring provider: Dr. Auston HPI:  Jon Ewing is a 77 y.o. male kindly referred by Dr. Auston for evaluation of sinonasal issues and drooling.   Initial visit (04/2023): He reports that he wants to establish care for his congestion/sinuses. He reports generally he is doing well. Using rinses, astelin , and nasacort . Having some PND currently. No pain/pressure in face, hyposmia, discolored drainage. He reports he did not know he was having sinus infections and it is extremely rare for me to get sinus infections  He also reports another issue regarding his drooling - that he has had salivation going on (off and on) for about 2 years. He reports that from time to time, he has some drooling/leakage of saliva ongoing from the sides of his mouth. Does not happen all the time, but not in response to any foods. Can be any time of the day. No specific triggers. Stops on its own. Nothing makes it better or worse. He has not tried anything for it. No gait changes, vision changes, or other behavior or neurological changes. He does report an intermittent unilateral hand tremor.   No dental work, sees education officer, community. No significant GERD -- no coughing, feels like it is under control. Mild dysphonia after COVID.   Patient otherwise denies: - dysphagia, odynophagia, aspiration episodes or PNA, need for Heimlich, unintentional weight loss - changes in voice (not normally, he is having some dysphonia after COVID), shortness of breath, hemoptysis, tobacco or significant alcohol history - ear pain, neck masses  He is on ropinorole - but holding now  H&N Surgery: FESS (2020) and UPPP, Mohs surgery nose Personal or FHx of bleeding dz or  anesthesia difficulty: no  AP/AC: no  PMHx: BPH (on finasteride), GERD  Tobacco: no. Alcohol: no. Occupation: Warden/ranger. Lives in [place], [place]  Independent Review of Additional Tests or Records:  Dr. Mable (03/19/2022): FESS 07/2018 and septoplasty, UPPP. Continued chronic  congestion and post nasal drip. Using nasacort , astelin . No recent h/o sinusitis, infection, abx therapy. He has reflux with globus and non-productive cough. On omeprazole . Seen for globus/reflux and cough. Continue current mgmt, f/u PRN Dr. 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 Dx: Dysphagia most likely reflux esophagitis v/s pill esophagitis. Rx: CXR, PPI BID, Decadron ; f/u GI CXR 02/08/2023: mild interstitial prominence but no consolidation Allergens w/Total IgE 2020: IgE 68, testing neg Referral notes 02/17/2023 and 04/07/2023 reviewed and uploaded or available in chart- COVID sx lingering, having difficulty swallowing, freq urination, fatigue/chills, Rx: Augmentin, , steroids; saliva in corner of his mouth - worse, no trigger; did not improve after holding ropinorole - now ref to ENT CBC 2020: wnl; Eos 2016 - 400  PMH/Meds/All/SocHx/FamHx/ROS:   Past Medical History:  Diagnosis Date   Allergic rhinitis    BCC (basal cell carcinoma of skin) 07/06/2018   Left neck    BCC (basal cell carcinoma) 03/01/2003   left inner cheek (CX35FU)   BCC (basal cell carcinoma) 01/07/2006   left  inner cheek (MOHS)   BCC (basal cell carcinoma) 02/15/2009   left post scalp (CX35FU)   BCC (basal cell carcinoma) 10/30/2009   left post neck (CX35FU)   Cough    Enlarged prostate     Hyperlipidemia    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   left side neck (CX35FU)   Prostate cancer (HCC)    Restless leg    SCC (squamous cell carcinoma) 12/26/2015   Left forehead (CX35FU)   Snoring    NPSG NEG for OSA     Past Surgical History:  Procedure Laterality Date   APPENDECTOMY     NASAL TURBINATE REDUCTION     Platoplasty     SEPTOPLASTY     age 48   SINOSCOPY     SINUS ENDO W/FUSION Bilateral 07/31/2018   Procedure: ENDOSCOPIC SINUS SURGERY WITH NAVIGATION;  Surgeon: Mable Lenis, MD;  Location: [facility] OR;  Service: ENT;  Laterality: Bilateral;   SINUS ENDO WITH FUSION Bilateral 07/31/2018   Procedure: SINUS ENDO WITH FUSION;  Surgeon: Mable Lenis, MD;  Location: [facility] OR;  Service: ENT;  Laterality: Bilateral;   TONSILLECTOMY     TURBINATE REDUCTION Bilateral 07/31/2018   Procedure: TURBINATE REDUCTION;  Surgeon: Mable Lenis, MD;  Location: [facility] OR;  Service: ENT;  Laterality: Bilateral;    Family History  Problem Relation Age of Onset   Stroke Father    Allergic rhinitis Mother    Angioedema Neg Hx    Atopy Neg Hx    Eczema Neg Hx    Immunodeficiency Neg Hx    Urticaria Neg Hx    Asthma Neg Hx      Social Connections: Not on file      Current Outpatient Medications:    Azelastine  HCl 0.15 % SOLN, Use 1-2 sprays per nostril 1-2 times a day as needed for drainage., Disp: 30 mL, Rfl: 5   finasteride (PROSCAR) 5 MG tablet, Take 5 mg by mouth every evening., Disp: , Rfl:    Hypertonic Nasal Wash (SINUS RINSE NA), daily., Disp: , Rfl:    ibuprofen (ADVIL,MOTRIN) 200 MG tablet, Take 400 mg by mouth every 6 (six) hours as needed for headache or moderate pain., Disp: , Rfl:    ipratropium (ATROVENT ) 0.06 % nasal spray, Place 2 sprays into both nostrils in the morning and at bedtime for 14 days., Disp: 15 mL, Rfl: 12   MYRBETRIQ 50 MG TB24 tablet, Take 50 mg by mouth daily., Disp: , Rfl:    omeprazole  (PRILOSEC) 40 MG capsule, Take 40 mg by mouth  daily., Disp: , Rfl:    rOPINIRole (REQUIP) 2 MG tablet, Take 2 mg by mouth at bedtime., Disp: , Rfl:    scopolamine  (TRANSDERM-SCOP) 1 MG/3DAYS, Place 1 patch (1.5 mg total) onto the skin every 3 (three) days., Disp: 4 patch, Rfl: 0   simvastatin (ZOCOR) 40 MG tablet, Take 40 mg by mouth every evening., Disp: , Rfl:    sodium chloride  (OCEAN) 0.65 % SOLN nasal spray, Place 1 spray into both nostrils 4 (four) times daily., Disp: , Rfl:    dexamethasone  (DECADRON ) 4 MG tablet, Take 1 tablet (4 mg total) by mouth daily. (Patient not taking: Reported on 05/20/2023), Disp: 3 tablet, Rfl: 0   meloxicam (MOBIC) 15 MG tablet, Take 15 mg by mouth daily., Disp: , Rfl:    Misc Natural Products (GLUCOSAMINE CHONDROITIN TRIPLE) TABS, Take 1 tablet by mouth 2 (two) times daily. (Patient not taking: Reported on 05/20/2023), Disp: , Rfl:  Nirmatrelvir  & Ritonavir  (PAXLOVID ) 20 x 150 MG & 10 x 100MG  TBPK, as directed Orally 5 days, Disp: 30 tablet, Rfl: 0   tamsulosin (FLOMAX) 0.4 MG CAPS capsule, Take 0.8 mg by mouth every evening. , Disp: , Rfl:    triamcinolone  (NASACORT ) 55 MCG/ACT AERO nasal inhaler, Place 2 sprays into the nose daily., Disp: 16.9 mL, Rfl: 5   Physical Exam:   BP 137/78 (BP Location: Right Arm, Patient Position: Sitting, Cuff Size: Normal)   Ht 5' 7 (1.702 m)   Wt 164 lb (74.4 kg)   BMI 25.69 kg/m   Salient findings:  CN II-XII intact Gait normal No significant tongue fasciculations  Bilateral EAC clear and TM intact with well pneumatized middle ear spaces Anterior rhinoscopy: Septum intact, midline; bilateral inferior turbinates reduced No lesions of oral cavity/oropharynx; dentition good; bilateral submandibular glands and parotid - able to express clear saliva; no skin lesions; uvula absent; mucous membranes moist - but no drooling today; no oral incompetence, appropriate lip and chin tone No obviously palpable neck masses/lymphadenopathy/thyromegaly No respiratory distress or  stridor; voice quality class 2  Seprately Identifiable Procedures:  Procedure Note Pre-procedure diagnosis:  Dysphonia, excessive drooling;  Post-procedure diagnosis: Same Procedure: Transnasal Fiberoptic Laryngoscopy, CPT 31575 - Mod 25 Indication: dysphonia, excessive drooling Complications: None apparent EBL: 0 mL  The procedure was undertaken to further evaluate the patient's complaint of dysphonia, drooling, with mirror exam inadequate for appropriate examination due to gag reflex and poor patient tolerance  Procedure:  Patient was identified as correct patient. Verbal consent was obtained. The nose was sprayed with oxymetazoline  and 4% lidocaine . The The flexible laryngoscope was passed through the nose to view the nasal cavity, pharynx (oropharynx, hypopharynx) and larynx.  The larynx was examined at rest and during multiple phonatory tasks. Documentation was obtained and reviewed with patient. The scope was removed. The patient tolerated the procedure well.  Findings: The nasal cavity and nasopharynx did not reveal any masses or lesions, mucosa appeared to be without obvious lesions. The tongue base, pharyngeal walls, piriform sinuses, vallecula, epiglottis and postcricoid region are normal in appearance - no significant retained secretions in pyriform or tongue base. The visualized portion of the subglottis and proximal trachea is widely patent. The vocal folds are mobile bilaterally. There are no lesions on the free edge of the vocal folds nor elsewhere in the larynx worrisome for malignancy. 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 A rigid 30 degree endoscope was utilized to evaluate the sinonasal cavities, mucosa, sinus ostia and turbinates and septum.  Overall, signs of mucosal inflammation are not noted.  Also noted are patent left maxillary and ethmoid cavities; on right; right MT lateralized so unable to visualize ethmoid or max cavities but no purulent secretions.  No mucopurulence, polyps, or masses noted.   Right Middle meatus: clear Right SE Recess: clear Left MM: clear Left SE Recess: clear Photodocumentation was obtained.  CPT CODE -- 68768 - Mod 25   Impression & Plans:  Jon Ewing is a 77 y.o. male w/ BPH now with:  1. Sialorrhea   2. Other chronic sinusitis  3. Dysphonia   4. S/P FESS (functional endoscopic sinus surgery)    Multiple issues, primarily sialorrhea. Exam otherwise unremarkable without significant retained secretions. He does have an intermittent hand tremor which is unilateral but unable to visualize it today. We discussed DDX - behavioral, neurological, excess salivary production, medication side effects, GERD, anatomic (oral incompetence), dysphagia related. Did d/c ropinirole which did not help. He is really not having any other significant symptoms.   He has an appt upcoming with Dr. Brien but would like to stay in [place] if possible As such, we discussed options and I think starting with a conservative course and ruling out other etiologies seems best: SLP for FEES to eval swallowing globally Chew gum/swallow promotion/biofeedback test Will try scopolamine  to see if it helps; he has BPH so will only do a short trial Will start atrovent  nasal spray to further reduce secretions Neurology referral to rule out any occult neuro condition that may be playing a role F/u in 6-8 weeks; consider botox (likely just submandibular) as an option  Chronic  sinusitis s/p FESS - Continue astelin , rinses    See below regarding exact medications prescribed this encounter including dosages and route: Meds ordered this encounter  Medications   scopolamine  (TRANSDERM-SCOP) 1 MG/3DAYS    Sig: Place 1 patch (1.5 mg total) onto the skin every 3 (three) days.    Dispense:  4 patch    Refill:  0   ipratropium (ATROVENT ) 0.06 % nasal spray    Sig: Place 2 sprays into both nostrils in the morning and at bedtime for 14 days.    Dispense:  15 mL    Refill:  12      Thank you for allowing me the opportunity to care for your patient. Please do not hesitate to contact me should you have any other questions.  Sincerely, Eldora Blanch, MD Otolarynoglogist (ENT), [facility] Phone: [phone] Fax: [phone]  05/20/2023, 9:23 AM   MDM:  Level 4 Complexity/Problems addressed: multiple chronic problems, one worsening Data complexity: mod - independent review of notes, labs, ordering tests - Morbidity: mod  - Prescription Drug prescribed or managed: yes

## 2023-05-23 NOTE — Progress Notes (Signed)
 Assessment/Plan:   1.  Rest tremor, by hx  -None seen today, but I certainly believe his history.  He does not meet criteria for Parkinsons disease.  Specifically, he is not bradykinetic, which is required for the diagnosis.  This, however, does not mean he is not at risk for the disease.  He and I discussed that his reported history of rest tremor in combination with sialorrhea (and reported restless leg) would make it suspicious for a dopaminergic process developing, but he does not meet criteria currently.  We discussed a DaTscan, but told him even if it was abnormal, it does not mean that he has Parkinson's disease.  It certainly would mean he is at risk for it.  We also discussed skin biopsies for alpha-synuclein, but I did not recommend it.  He and I discussed the details of those.  2.  Sialorrhea  -We discussed Myobloc and Xeomin .  Because of age, anticholinergics are not ideal or indicated.  Patient would like to stay away from those.  He asked for literature on Myobloc and we gave him what we had.  He is going to think about that and he would let us  know if he would like to proceed.  3.  Regardless of whether or not he decides to proceed with Myobloc, I would like to see the patient back at least on a yearly basis, sooner should new neurologic issues arise.  Subjective:   Jon Ewing was seen today in the movement disorders clinic for neurologic consultation at the request of Tobie Eldora NOVAK, MD.  The consultation is for the evaluation of sialorrhea and intermittent hand tremor and to rule out Parkinson's.  Patient was seen by ENT on May 20, 2023 for congestion and sinus issues and mentioned the above issues.  He was started on scopolamine  and sent here to rule out neurologic reasons for the symptoms.  Pt has not started the scopolomine yet  Patient is on ropinirole 2 mg at bedtime for rls x several years.  It helps.     Specific Symptoms:  Tremor: Yes.  , occasionally.  He  writes with the L hand but eats with the R hand.  Tremor is on the R hand and notes it with driving and the R hand is on the lap.  Daughter occasionally notes it.  He notes no tremor elsewhere.  Tremor R hand x 1 year Family hx of similar:  Yes.   he reports mom and sister have benign palsy (? Benign essential tremor) Voice: wife thinks voice is softer Sleep: sleeps well  Vivid Dreams:  No.  Acting out dreams:  No. Wet Pillows: No. Postural symptoms:  balance is generally good but when starts up stairs, he will rock back for a moment but no falls  Falls?  No. Bradykinesia symptoms: drooling while awake x few years and has actually lessened since he had covid a few months ago; no shuffle; no slow motions Loss of smell:  No. Loss of taste:  No. Urinary Incontinence:  No. (Myrbetriq has helped) Difficulty Swallowing:  No. Handwriting, micrographia: No. Trouble with ADL's:  No.  Trouble buttoning clothing: No. Depression:  No. Memory changes:  No. N/V:  No., nothing consistent Lightheaded:  No.  Syncope: No. Diplopia:  No. Prior exposure to reglan/antipsychotics: No.  Neuroimaging of the brain has not previously been performed.    ALLERGIES:  No Known Allergies  CURRENT MEDICATIONS:  Current Meds  Medication Sig   Azelastine  HCl  0.15 % SOLN Use 1-2 sprays per nostril 1-2 times a day as needed for drainage.   finasteride (PROSCAR) 5 MG tablet Take 5 mg by mouth every evening.   Hypertonic Nasal Wash (SINUS RINSE NA) daily.   ibuprofen (ADVIL,MOTRIN) 200 MG tablet Take 400 mg by mouth every 6 (six) hours as needed for headache or moderate pain.   ipratropium (ATROVENT ) 0.06 % nasal spray Place 2 sprays into both nostrils in the morning and at bedtime for 14 days.   MYRBETRIQ 50 MG TB24 tablet Take 50 mg by mouth daily.   omeprazole  (PRILOSEC) 40 MG capsule Take 40 mg by mouth daily.   rOPINIRole (REQUIP) 2 MG tablet Take 2 mg by mouth at bedtime.   simvastatin (ZOCOR) 40 MG  tablet Take 40 mg by mouth every evening.   sodium chloride  (OCEAN) 0.65 % SOLN nasal spray Place 1 spray into both nostrils 4 (four) times daily.     Objective:   VITALS:   Vitals:   05/27/23 1157  BP: 137/82  Pulse: 84  SpO2: 98%  Weight: 169 lb 3.2 oz (76.7 kg)  Height: 5' 7 (1.702 m)    GEN:  The patient appears stated age and is in NAD. HEENT:  Normocephalic, atraumatic.  The mucous membranes are moist. The superficial temporal arteries are without ropiness or tenderness.  No evidence of drooling today. CV:  RRR with 2-3/6 sem Lungs:  CTAB Neck/HEME:  There are no carotid bruits bilaterally.  Neurological examination:  Orientation: The patient is alert and oriented x3.  Cranial nerves: There is good facial symmetry. Extraocular muscles are intact. The visual fields are full to confrontational testing. The speech is fluent and clear. Soft palate rises symmetrically and there is no tongue deviation. Hearing is intact to conversational tone. Sensation: Sensation is intact to light and pinprick throughout (facial, trunk, extremities). Vibration is intact at the bilateral big toe. There is no extinction with double simultaneous stimulation. There is no sensory dermatomal level identified. Motor: Strength is 5/5 in the bilateral upper and lower extremities.   Shoulder shrug is equal and symmetric.  There is no pronator drift. Deep tendon reflexes: Deep tendon reflexes are 2+/4 at the bilateral biceps, triceps, brachioradialis, 3/4 at the bilateral patella and achilles. Plantar responses are downgoing bilaterally.  Movement examination: Tone: There is nl tone in the bilateral upper extremities.  The tone in the lower extremities is nl.  Abnormal movements: there is no rest tremor, even with distraction procedures.   Coordination:  There is no decremation with RAM's, with any form of RAMS, including alternating supination and pronation of the forearm, hand opening and closing, finger  taps, heel taps and toe taps.  Gait and Station: The patient has no difficulty arising out of a deep-seated chair without the use of the hands. The patient's stride length is good with good armswing.    I have reviewed and interpreted the following labs independently   Chemistry      Component Value Date/Time   NA 137 07/23/2018 1012   K 4.0 07/23/2018 1012   CL 109 07/23/2018 1012   CO2 23 07/23/2018 1012   BUN 9 07/23/2018 1012   CREATININE 0.88 07/23/2018 1012      Component Value Date/Time   CALCIUM  9.1 07/23/2018 1012      No results found for: TSH Lab Results  Component Value Date   WBC 5.3 07/23/2018   HGB 13.5 07/23/2018   HCT 41.8 07/23/2018   MCV  91.3 07/23/2018   PLT 291 07/23/2018     Total time spent on today's visit was 45 minutes, including both face-to-face time and nonface-to-face time.  Time included that spent on review of records (prior notes available to me/labs/imaging if pertinent), discussing treatment and goals, answering patient's questions and coordinating care.  Cc:  Merilee, L.Addie, MD (Inactive)

## 2023-05-27 ENCOUNTER — Ambulatory Visit: Payer: Medicare PPO | Admitting: Neurology

## 2023-05-27 ENCOUNTER — Encounter: Payer: Self-pay | Admitting: Neurology

## 2023-05-27 VITALS — BP 137/82 | HR 84 | Ht 67.0 in | Wt 169.2 lb

## 2023-05-27 DIAGNOSIS — K117 Disturbances of salivary secretion: Secondary | ICD-10-CM

## 2023-05-27 DIAGNOSIS — R251 Tremor, unspecified: Secondary | ICD-10-CM

## 2023-05-27 NOTE — Patient Instructions (Signed)
 We discussed that you do NOT meet the clinical criteria for Parkinsons Disease currently.  This doesn't mean it won't come in the future.  DaTscan can look at whether or not you are at risk for this BUT it wouldn't change the treatment.  DaTscan can show loss of dopamine up to a decade prior the diagnosis of Parkinsons Disease.    We discussed myobloc/botox for drooling.  You can let us  know if you want to do that and we can do authorization via insurance.  The physicians and staff at [facility] are committed to providing excellent care. You may receive a survey requesting feedback about your experience at our office. We strive to receive very good responses to the survey questions. If you feel that your experience would prevent you from giving the office a very good  response, please contact our office to try to remedy the situation. We may be reached at [phone]. Thank you for taking the time out of your busy day to complete the survey.

## 2023-06-06 ENCOUNTER — Telehealth (INDEPENDENT_AMBULATORY_CARE_PROVIDER_SITE_OTHER): Payer: Self-pay

## 2023-06-06 NOTE — Telephone Encounter (Signed)
Patient states he needs a refill on omeprazole. He says that Dr. Allena Katz will be taking over care from Dr. Annalee Genta from [facility] who has now retired. He wants it sent to [facility] in [place].

## 2023-06-09 ENCOUNTER — Other Ambulatory Visit (INDEPENDENT_AMBULATORY_CARE_PROVIDER_SITE_OTHER): Payer: Self-pay

## 2023-06-09 MED ORDER — OMEPRAZOLE 40 MG PO CPDR: 40.0000 mg | DELAYED_RELEASE_CAPSULE | Freq: Every day | ORAL | 3 refills | Status: DC

## 2023-07-07 ENCOUNTER — Other Ambulatory Visit (INDEPENDENT_AMBULATORY_CARE_PROVIDER_SITE_OTHER): Payer: Self-pay

## 2023-07-07 ENCOUNTER — Telehealth (INDEPENDENT_AMBULATORY_CARE_PROVIDER_SITE_OTHER): Payer: Self-pay | Admitting: Otolaryngology

## 2023-07-07 MED ORDER — OMEPRAZOLE 40 MG PO CPDR: 40.0000 mg | DELAYED_RELEASE_CAPSULE | Freq: Every day | ORAL | 3 refills | Status: AC

## 2023-07-07 MED ORDER — OMEPRAZOLE 40 MG PO CPDR
40.0000 mg | DELAYED_RELEASE_CAPSULE | Freq: Every day | ORAL | 3 refills | Status: DC
Start: 2023-07-07 — End: 2023-07-07

## 2023-07-07 NOTE — Telephone Encounter (Signed)
Patient called this morning and requested Omeprazole 40 mg #90 to send to [facility] in [place].

## 2023-07-15 ENCOUNTER — Ambulatory Visit (INDEPENDENT_AMBULATORY_CARE_PROVIDER_SITE_OTHER): Payer: Medicare PPO

## 2023-07-22 ENCOUNTER — Telehealth: Payer: Self-pay | Admitting: Neurology

## 2023-07-22 NOTE — Telephone Encounter (Signed)
 Pt cld Would like to discuss Botox options and if insurance covers the treatment

## 2023-07-23 ENCOUNTER — Other Ambulatory Visit (HOSPITAL_COMMUNITY): Payer: Self-pay

## 2023-07-23 ENCOUNTER — Telehealth: Payer: Self-pay | Admitting: Pharmacy Technician

## 2023-07-23 NOTE — Telephone Encounter (Signed)
 Pharmacy Patient Advocate Encounter   Received notification from Physician's Office that prior authorization for [facility] 5000 is required/requested.   Insurance verification completed.   The patient is insured through [place] .   Per test claim: PA required; PA submitted to above mentioned insurance via CoverMyMeds Key/confirmation #/EOC [id number] Status is pending

## 2023-08-04 ENCOUNTER — Telehealth: Payer: Self-pay | Admitting: Neurology

## 2023-08-04 NOTE — Telephone Encounter (Signed)
 Caller stated he was approved for Botox and would like to discuss details with nurse about moving forward.

## 2023-08-05 ENCOUNTER — Telehealth: Payer: Self-pay

## 2023-08-05 NOTE — Telephone Encounter (Signed)
 Patient called on status of his Myobloc. I have reached out to Pa team regarding this approval

## 2023-08-05 NOTE — Telephone Encounter (Signed)
 Pharmacy Patient Advocate Encounter-   Buy/Bill J code: I6962 CPT code: 95284 Dx Code: K11.7  PA was submitted to [facility] and has been approved through: 3.5.25 TO 12.31.25 Authorization# [id number]   Co-insurance on J-code: NO COINSURANCE Copay for CPT Code: $0 Deductible: NO DEDUCTIBLE Out Of Pocket Expense: $4000 Amount paid toward OOP: $200  Call reference# [id number]

## 2023-08-27 DIAGNOSIS — M19011 Primary osteoarthritis, right shoulder: Secondary | ICD-10-CM | POA: Diagnosis not present

## 2023-08-27 DIAGNOSIS — M1711 Unilateral primary osteoarthritis, right knee: Secondary | ICD-10-CM | POA: Diagnosis not present

## 2023-09-01 ENCOUNTER — Telehealth: Payer: Self-pay | Admitting: Pharmacy Technician

## 2023-09-01 ENCOUNTER — Other Ambulatory Visit (HOSPITAL_COMMUNITY): Payer: Self-pay

## 2023-09-01 ENCOUNTER — Telehealth: Payer: Self-pay

## 2023-09-01 NOTE — Telephone Encounter (Signed)
 I am checking on this patient I need a PA for Xeomin 100 units instead of Myobloc since we can't get the Myobloc in

## 2023-09-01 NOTE — Telephone Encounter (Signed)
 PA has been submitted, and telephone encounter has been created. Please see telephone encounter dated 4.14.25.

## 2023-09-01 NOTE — Telephone Encounter (Signed)
 Pharmacy Patient Advocate Encounter  Pharmacy Benefit PA has been submitted for Xeomin- [id number] via CoverMyMeds.  INSURANCE: [employer] KEY/EOC/FAX: [id number] Procedure code 62952  PENDING require a PA Status is Pending

## 2023-09-01 NOTE — Telephone Encounter (Signed)
 Pharmacy Patient Advocate Encounter- Injection via Pharmacy Benefit:  PA was submitted  for Xeomin- [id number] to [employer] and has been approved through: 1.1.25 TO 12.31.25 Authorization# [id number]  Please send prescription to Specialty Pharmacy: [facility]: [phone] Estimated Pharmacy Copay is: $128  Patient IS NOT eligible for Xeomin- [id number] Copay Card, which will make patient's copay as little as zero. Copay card will be provided to pharmacy.   Admin Code: 57846   does not require Prior Auth. Patient estimated coinsurance: NO COINSURANCE Patient's remaining deductible: NO DEDUCTIBLE

## 2023-09-02 ENCOUNTER — Other Ambulatory Visit: Payer: Self-pay

## 2023-09-02 MED ORDER — XEOMIN 100 UNITS IM SOLR: INTRAMUSCULAR | 0 refills | Status: DC

## 2023-09-03 NOTE — Telephone Encounter (Signed)
 Due to backorder of Myobloc, change to Xeomin. See telephone encounter 4.14.25.

## 2023-09-15 DIAGNOSIS — G2581 Restless legs syndrome: Secondary | ICD-10-CM | POA: Diagnosis not present

## 2023-09-15 DIAGNOSIS — R7309 Other abnormal glucose: Secondary | ICD-10-CM | POA: Diagnosis not present

## 2023-09-15 DIAGNOSIS — K219 Gastro-esophageal reflux disease without esophagitis: Secondary | ICD-10-CM | POA: Diagnosis not present

## 2023-09-15 DIAGNOSIS — E7800 Pure hypercholesterolemia, unspecified: Secondary | ICD-10-CM | POA: Diagnosis not present

## 2023-09-17 DIAGNOSIS — Z0189 Encounter for other specified special examinations: Secondary | ICD-10-CM | POA: Diagnosis not present

## 2023-09-17 DIAGNOSIS — E7800 Pure hypercholesterolemia, unspecified: Secondary | ICD-10-CM | POA: Diagnosis not present

## 2023-09-17 DIAGNOSIS — Z0000 Encounter for general adult medical examination without abnormal findings: Secondary | ICD-10-CM | POA: Diagnosis not present

## 2023-09-17 DIAGNOSIS — K219 Gastro-esophageal reflux disease without esophagitis: Secondary | ICD-10-CM | POA: Diagnosis not present

## 2023-09-17 DIAGNOSIS — R7303 Prediabetes: Secondary | ICD-10-CM | POA: Diagnosis not present

## 2023-09-17 DIAGNOSIS — J309 Allergic rhinitis, unspecified: Secondary | ICD-10-CM | POA: Diagnosis not present

## 2023-09-17 DIAGNOSIS — R7309 Other abnormal glucose: Secondary | ICD-10-CM | POA: Diagnosis not present

## 2023-09-17 DIAGNOSIS — G2581 Restless legs syndrome: Secondary | ICD-10-CM | POA: Diagnosis not present

## 2023-09-17 DIAGNOSIS — I358 Other nonrheumatic aortic valve disorders: Secondary | ICD-10-CM | POA: Diagnosis not present

## 2023-09-26 DIAGNOSIS — M1711 Unilateral primary osteoarthritis, right knee: Secondary | ICD-10-CM | POA: Diagnosis not present

## 2023-10-07 DIAGNOSIS — H6122 Impacted cerumen, left ear: Secondary | ICD-10-CM | POA: Diagnosis not present

## 2023-10-07 DIAGNOSIS — H9192 Unspecified hearing loss, left ear: Secondary | ICD-10-CM | POA: Diagnosis not present

## 2023-10-07 DIAGNOSIS — H6120 Impacted cerumen, unspecified ear: Secondary | ICD-10-CM | POA: Diagnosis not present

## 2023-10-08 DIAGNOSIS — M1711 Unilateral primary osteoarthritis, right knee: Secondary | ICD-10-CM | POA: Diagnosis not present

## 2023-10-09 ENCOUNTER — Other Ambulatory Visit: Payer: Self-pay

## 2023-10-09 DIAGNOSIS — K117 Disturbances of salivary secretion: Secondary | ICD-10-CM

## 2023-10-09 MED ORDER — XEOMIN 100 UNITS IM SOLR: INTRAMUSCULAR | 0 refills | Status: AC

## 2023-10-15 DIAGNOSIS — M1711 Unilateral primary osteoarthritis, right knee: Secondary | ICD-10-CM | POA: Diagnosis not present

## 2023-10-17 ENCOUNTER — Ambulatory Visit: Admitting: Neurology

## 2023-10-22 DIAGNOSIS — M1711 Unilateral primary osteoarthritis, right knee: Secondary | ICD-10-CM | POA: Diagnosis not present

## 2023-10-28 DIAGNOSIS — Z713 Dietary counseling and surveillance: Secondary | ICD-10-CM | POA: Diagnosis not present

## 2023-10-28 DIAGNOSIS — R7303 Prediabetes: Secondary | ICD-10-CM | POA: Diagnosis not present

## 2023-11-03 ENCOUNTER — Encounter: Payer: Self-pay | Admitting: Neurology

## 2023-11-08 DIAGNOSIS — J209 Acute bronchitis, unspecified: Secondary | ICD-10-CM | POA: Diagnosis not present

## 2023-11-24 DIAGNOSIS — E7800 Pure hypercholesterolemia, unspecified: Secondary | ICD-10-CM | POA: Diagnosis not present

## 2023-11-24 DIAGNOSIS — Z7189 Other specified counseling: Secondary | ICD-10-CM | POA: Diagnosis not present

## 2023-11-24 DIAGNOSIS — N400 Enlarged prostate without lower urinary tract symptoms: Secondary | ICD-10-CM | POA: Diagnosis not present

## 2023-11-24 DIAGNOSIS — K219 Gastro-esophageal reflux disease without esophagitis: Secondary | ICD-10-CM | POA: Diagnosis not present

## 2023-11-24 DIAGNOSIS — Z23 Encounter for immunization: Secondary | ICD-10-CM | POA: Diagnosis not present

## 2023-11-26 DIAGNOSIS — M1711 Unilateral primary osteoarthritis, right knee: Secondary | ICD-10-CM | POA: Diagnosis not present

## 2023-12-11 DIAGNOSIS — H53002 Unspecified amblyopia, left eye: Secondary | ICD-10-CM | POA: Diagnosis not present

## 2023-12-11 DIAGNOSIS — H2513 Age-related nuclear cataract, bilateral: Secondary | ICD-10-CM | POA: Diagnosis not present

## 2023-12-11 DIAGNOSIS — H5203 Hypermetropia, bilateral: Secondary | ICD-10-CM | POA: Diagnosis not present

## 2023-12-11 DIAGNOSIS — H3540 Unspecified peripheral retinal degeneration: Secondary | ICD-10-CM | POA: Diagnosis not present

## 2023-12-19 ENCOUNTER — Ambulatory Visit: Admitting: Neurology

## 2023-12-26 ENCOUNTER — Ambulatory Visit: Admitting: Neurology

## 2023-12-26 DIAGNOSIS — L814 Other melanin hyperpigmentation: Secondary | ICD-10-CM | POA: Diagnosis not present

## 2023-12-26 DIAGNOSIS — L821 Other seborrheic keratosis: Secondary | ICD-10-CM | POA: Diagnosis not present

## 2023-12-26 DIAGNOSIS — D225 Melanocytic nevi of trunk: Secondary | ICD-10-CM | POA: Diagnosis not present

## 2023-12-26 DIAGNOSIS — L820 Inflamed seborrheic keratosis: Secondary | ICD-10-CM | POA: Diagnosis not present

## 2023-12-26 DIAGNOSIS — L570 Actinic keratosis: Secondary | ICD-10-CM | POA: Diagnosis not present

## 2023-12-26 DIAGNOSIS — Z85828 Personal history of other malignant neoplasm of skin: Secondary | ICD-10-CM | POA: Diagnosis not present

## 2023-12-26 DIAGNOSIS — Z85820 Personal history of malignant melanoma of skin: Secondary | ICD-10-CM | POA: Diagnosis not present

## 2024-01-01 ENCOUNTER — Ambulatory Visit: Admitting: Neurology

## 2024-01-09 ENCOUNTER — Ambulatory Visit: Admitting: Neurology

## 2024-01-21 DIAGNOSIS — M1711 Unilateral primary osteoarthritis, right knee: Secondary | ICD-10-CM | POA: Diagnosis not present

## 2024-01-21 DIAGNOSIS — R3915 Urgency of urination: Secondary | ICD-10-CM | POA: Diagnosis not present

## 2024-01-21 DIAGNOSIS — N401 Enlarged prostate with lower urinary tract symptoms: Secondary | ICD-10-CM | POA: Diagnosis not present

## 2024-01-21 DIAGNOSIS — C61 Malignant neoplasm of prostate: Secondary | ICD-10-CM | POA: Diagnosis not present

## 2024-01-29 ENCOUNTER — Ambulatory Visit: Admitting: Neurology

## 2024-02-10 ENCOUNTER — Ambulatory Visit: Admitting: Neurology

## 2024-02-27 DIAGNOSIS — M1711 Unilateral primary osteoarthritis, right knee: Secondary | ICD-10-CM | POA: Diagnosis not present

## 2024-03-08 DIAGNOSIS — R7303 Prediabetes: Secondary | ICD-10-CM | POA: Diagnosis not present

## 2024-03-08 DIAGNOSIS — K219 Gastro-esophageal reflux disease without esophagitis: Secondary | ICD-10-CM | POA: Diagnosis not present

## 2024-03-08 DIAGNOSIS — E7800 Pure hypercholesterolemia, unspecified: Secondary | ICD-10-CM | POA: Diagnosis not present

## 2024-03-08 DIAGNOSIS — G2581 Restless legs syndrome: Secondary | ICD-10-CM | POA: Diagnosis not present

## 2024-03-09 DIAGNOSIS — G2581 Restless legs syndrome: Secondary | ICD-10-CM | POA: Diagnosis not present

## 2024-03-09 DIAGNOSIS — J309 Allergic rhinitis, unspecified: Secondary | ICD-10-CM | POA: Diagnosis not present

## 2024-03-09 DIAGNOSIS — Z7189 Other specified counseling: Secondary | ICD-10-CM | POA: Diagnosis not present

## 2024-03-09 DIAGNOSIS — R7309 Other abnormal glucose: Secondary | ICD-10-CM | POA: Diagnosis not present

## 2024-03-09 DIAGNOSIS — I358 Other nonrheumatic aortic valve disorders: Secondary | ICD-10-CM | POA: Diagnosis not present

## 2024-03-09 DIAGNOSIS — N400 Enlarged prostate without lower urinary tract symptoms: Secondary | ICD-10-CM | POA: Diagnosis not present

## 2024-03-09 DIAGNOSIS — K219 Gastro-esophageal reflux disease without esophagitis: Secondary | ICD-10-CM | POA: Diagnosis not present

## 2024-03-09 DIAGNOSIS — E7800 Pure hypercholesterolemia, unspecified: Secondary | ICD-10-CM | POA: Diagnosis not present

## 2024-03-09 DIAGNOSIS — R7303 Prediabetes: Secondary | ICD-10-CM | POA: Diagnosis not present

## 2024-04-07 DIAGNOSIS — I358 Other nonrheumatic aortic valve disorders: Secondary | ICD-10-CM | POA: Diagnosis not present

## 2024-04-20 NOTE — Progress Notes (Deleted)
 Assessment/Plan:  Assessment and Plan Assessment & Plan     1.  Rest tremor, by hx             -None seen today, but I certainly believe his history.  He does not meet criteria for Parkinsons disease.  Specifically, he is not bradykinetic, which is required for the diagnosis.  This, however, does not mean he is not at risk for the disease.  He and I discussed that his reported history of rest tremor in combination with sialorrhea (and reported restless leg) would make it suspicious for a dopaminergic process developing, but he does not meet criteria currently.  We discussed a DaTscan, but told him even if it was abnormal, it does not mean that he has Parkinson's disease.  It certainly would mean he is at risk for it.  We also discussed skin biopsies for alpha-synuclein, but I did not recommend it.  He and I discussed the details of those.   2.  Sialorrhea             -We discussed Myobloc and Xeomin .  Because of age, anticholinergics are not ideal or indicated.  Patient would like to stay away from those.  He asked for literature on Myobloc and we gave him what we had.  He is going to think about that and he would let us  know if he would like to proceed.   Subjective:   Discussed the use of AI scribe software for clinical note transcription with the patient, who gave verbal consent to proceed.  History of Present Illness    Patient seen today in 1 year follow-up for tremor as well as sialorrhea.  ***Note that patient is on 2 mg of ropinirole at bedtime for restless leg and has been for years Current prescribed movement disorder medications: ***   PREVIOUS MEDICATIONS: {Parkinson's RX:18200}  ALLERGIES:  No Known Allergies  CURRENT MEDICATIONS:  No outpatient medications have been marked as taking for the 04/22/24 encounter (Appointment) with Niyonna Betsill, Asberry RAMAN, DO.     Objective:   PHYSICAL EXAMINATION:    VITALS:  There were no vitals filed for this visit.  GEN:  The  patient appears stated age and is in NAD. HEENT:  Normocephalic, atraumatic.  The mucous membranes are moist. The superficial temporal arteries are without ropiness or tenderness. CV:  RRR Lungs:  CTAB Neck/HEME:  There are no carotid bruits bilaterally.  Neurological examination:  Orientation: The patient is alert and oriented x3. Cranial nerves: There is good facial symmetry with*** facial hypomimia. The speech is fluent and clear. Soft palate rises symmetrically and there is no tongue deviation. Hearing is intact to conversational tone. Sensation: Sensation is intact to light touch throughout Motor: Strength is at least antigravity x4.  Movement examination: Tone: There is nl tone in the bilateral upper extremities.  The tone in the lower extremities is nl.  Abnormal movements: there is no rest tremor, even with distraction procedures.   Coordination:  There is no decremation with RAM's, with any form of RAMS, including alternating supination and pronation of the forearm, hand opening and closing, finger taps, heel taps and toe taps.  Gait and Station: The patient has no difficulty arising out of a deep-seated chair without the use of the hands. The patient's stride length is good with good armswing.    I have reviewed and interpreted the following labs independently    Chemistry      Component Value Date/Time  NA 137 07/23/2018 1012   K 4.0 07/23/2018 1012   CL 109 07/23/2018 1012   CO2 23 07/23/2018 1012   BUN 9 07/23/2018 1012   CREATININE 0.88 07/23/2018 1012      Component Value Date/Time   CALCIUM  9.1 07/23/2018 1012       Lab Results  Component Value Date   WBC 5.3 07/23/2018   HGB 13.5 07/23/2018   HCT 41.8 07/23/2018   MCV 91.3 07/23/2018   PLT 291 07/23/2018    No results found for: TSH   Total time spent on today's visit was ***30 minutes, including both face-to-face time and nonface-to-face time.  Time included that spent on review of records (prior  notes available to me/labs/imaging if pertinent), discussing treatment and goals, answering patient's questions and coordinating care.  Cc:  Merilee, L.Addie, MD (Inactive)

## 2024-04-22 ENCOUNTER — Ambulatory Visit: Admitting: Neurology

## 2024-05-07 ENCOUNTER — Ambulatory Visit: Admitting: Neurology

## 2024-05-26 ENCOUNTER — Ambulatory Visit: Payer: Medicare PPO | Admitting: Neurology

## 2024-06-10 ENCOUNTER — Other Ambulatory Visit: Payer: Self-pay | Admitting: Family Medicine

## 2024-06-10 DIAGNOSIS — K7689 Other specified diseases of liver: Secondary | ICD-10-CM

## 2024-06-24 ENCOUNTER — Ambulatory Visit
Admission: RE | Admit: 2024-06-24 | Discharge: 2024-06-24 | Disposition: A | Source: Ambulatory Visit | Attending: Family Medicine | Admitting: Family Medicine

## 2024-06-24 DIAGNOSIS — K7689 Other specified diseases of liver: Secondary | ICD-10-CM

## 2024-06-24 MED ORDER — GADOPICLENOL 0.5 MMOL/ML IV SOLN
9.0000 mL | Freq: Once | INTRAVENOUS | Status: AC | PRN
  Administered 2024-06-24: 9 mL via INTRAVENOUS
